# Patient Record
Sex: Female | Born: 1955 | Race: White | Hispanic: No | Smoking: Current every day smoker
Health system: Southern US, Community
[De-identification: ages and names within clinical notes are randomized; demographics above are authoritative.]

## PROBLEM LIST (undated history)

## (undated) DIAGNOSIS — G8929 Other chronic pain: Secondary | ICD-10-CM

## (undated) DIAGNOSIS — B1920 Unspecified viral hepatitis C without hepatic coma: Secondary | ICD-10-CM

## (undated) DIAGNOSIS — K7460 Unspecified cirrhosis of liver: Secondary | ICD-10-CM

## (undated) DIAGNOSIS — F410 Panic disorder [episodic paroxysmal anxiety] without agoraphobia: Secondary | ICD-10-CM

## (undated) DIAGNOSIS — K429 Umbilical hernia without obstruction or gangrene: Secondary | ICD-10-CM

## (undated) DIAGNOSIS — F319 Bipolar disorder, unspecified: Secondary | ICD-10-CM

## (undated) DIAGNOSIS — F419 Anxiety disorder, unspecified: Secondary | ICD-10-CM

## (undated) DIAGNOSIS — R011 Cardiac murmur, unspecified: Secondary | ICD-10-CM

## (undated) HISTORY — DX: Bipolar disorder, unspecified: F31.9

## (undated) HISTORY — DX: Other chronic pain: G89.29

## (undated) HISTORY — DX: Cardiac murmur, unspecified: R01.1

## (undated) HISTORY — DX: Panic disorder (episodic paroxysmal anxiety): F41.0

## (undated) HISTORY — DX: Unspecified viral hepatitis C without hepatic coma: B19.20

---

## 1993-04-10 DIAGNOSIS — B1920 Unspecified viral hepatitis C without hepatic coma: Secondary | ICD-10-CM

## 1993-04-10 HISTORY — DX: Unspecified viral hepatitis C without hepatic coma: B19.20

## 1998-04-10 HISTORY — PX: BREAST SURGERY: SHX581

## 2003-04-11 HISTORY — PX: LIVER BIOPSY: SHX301

## 2004-10-14 ENCOUNTER — Emergency Department: Payer: Self-pay | Admitting: General Practice

## 2011-04-17 ENCOUNTER — Other Ambulatory Visit: Payer: Self-pay

## 2011-04-17 LAB — HEPATIC FUNCTION PANEL A (ARMC)
Bilirubin,Total: 2.3 mg/dL — ABNORMAL HIGH (ref 0.2–1.0)
SGPT (ALT): 45 U/L
Total Protein: 8.6 g/dL — ABNORMAL HIGH (ref 6.4–8.2)

## 2012-09-16 ENCOUNTER — Ambulatory Visit: Payer: Self-pay | Admitting: Internal Medicine

## 2012-10-14 ENCOUNTER — Ambulatory Visit: Payer: Self-pay | Admitting: Internal Medicine

## 2012-11-06 ENCOUNTER — Encounter: Payer: Self-pay | Admitting: *Deleted

## 2012-11-27 ENCOUNTER — Encounter: Payer: Self-pay | Admitting: General Surgery

## 2012-11-27 ENCOUNTER — Ambulatory Visit (INDEPENDENT_AMBULATORY_CARE_PROVIDER_SITE_OTHER): Payer: BC Managed Care – PPO | Admitting: General Surgery

## 2012-11-27 VITALS — BP 144/74 | HR 84 | Resp 12 | Ht 63.0 in | Wt 127.0 lb

## 2012-11-27 DIAGNOSIS — K824 Cholesterolosis of gallbladder: Secondary | ICD-10-CM | POA: Insufficient documentation

## 2012-11-27 NOTE — Patient Instructions (Addendum)
Outpatient surgery   Laparoscopic Cholecystectomy Laparoscopic cholecystectomy is surgery to remove the gallbladder. The gallbladder is located slightly to the right of center in the abdomen, behind the liver. It is a concentrating and storage sac for the bile produced in the liver. Bile aids in the digestion and absorption of fats. Gallbladder disease (cholecystitis) is an inflammation of your gallbladder. This condition is usually caused by a buildup of gallstones (cholelithiasis) in your gallbladder. Gallstones can block the flow of bile, resulting in inflammation and pain. In severe cases, emergency surgery may be required. When emergency surgery is not required, you will have time to prepare for the procedure. Laparoscopic surgery is an alternative to open surgery. Laparoscopic surgery usually has a shorter recovery time. Your common bile duct may also need to be examined and explored. Your caregiver will discuss this with you if he or she feels this should be done. If stones are found in the common bile duct, they may be removed. LET YOUR CAREGIVER KNOW ABOUT:  Allergies to food or medicine.  Medicines taken, including vitamins, herbs, eyedrops, over-the-counter medicines, and creams.  Use of steroids (by mouth or creams).  Previous problems with anesthetics or numbing medicines.  History of bleeding problems or blood clots.  Previous surgery.  Other health problems, including diabetes and kidney problems.  Possibility of pregnancy, if this applies. RISKS AND COMPLICATIONS All surgery is associated with risks. Some problems that may occur following this procedure include:  Infection.  Damage to the common bile duct, nerves, arteries, veins, or other internal organs such as the stomach or intestines.  Bleeding.  A stone may remain in the common bile duct. BEFORE THE PROCEDURE  Do not take aspirin for 3 days prior to surgery or blood thinners for 1 week prior to surgery.  Do  not eat or drink anything after midnight the night before surgery.  Let your caregiver know if you develop a cold or other infectious problem prior to surgery.  You should be present 60 minutes before the procedure or as directed. PROCEDURE  You will be given medicine that makes you sleep (general anesthetic). When you are asleep, your surgeon will make several small cuts (incisions) in your abdomen. One of these incisions is used to insert a small, lighted scope (laparoscope) into the abdomen. The laparoscope helps the surgeon see into your abdomen. Carbon dioxide gas will be pumped into your abdomen. The gas allows more room for the surgeon to perform your surgery. Other operating instruments are inserted through the other incisions. Laparoscopic procedures may not be appropriate when:  There is major scarring from previous surgery.  The gallbladder is extremely inflamed.  There are bleeding disorders or unexpected cirrhosis of the liver.  A pregnancy is near term.  Other conditions make the laparoscopic procedure impossible. If your surgeon feels it is not safe to continue with a laparoscopic procedure, he or she will perform an open abdominal procedure. In this case, the surgeon will make an incision to open the abdomen. This gives the surgeon a larger view and field to work within. This may allow the surgeon to perform procedures that sometimes cannot be performed with a laparoscope alone. Open surgery has a longer recovery time. AFTER THE PROCEDURE  You will be taken to the recovery area where a nurse will watch and check your progress.  You may be allowed to go home the same day.  Do not resume physical activities until directed by your caregiver.  You may resume  a normal diet and activities as directed. Document Released: 03/27/2005 Document Revised: 06/19/2011 Document Reviewed: 09/09/2010 [facility] Patient Information 2014 [place], [place].

## 2012-11-27 NOTE — Progress Notes (Signed)
Patient ID: Stephanie Beck, female   DOB: 07/23/1955, 57 y.o.   MRN: [id number]  Chief Complaint  Patient presents with  . Other    gall bladder     HPI Stephanie Beck is a 57 y.o. female.  Patient here today for evaluation of her gallbladder.  She states her liver labs are checked regularly because of Hep C and they were elevated.  She did have a CT scan and U/S done that showed sludge.  She denies abdominal pain, nausea, or vomiting. Denies any food related abdominal pain.   HPI  Past Medical History  Diagnosis Date  . Panic attack   . Hepatitis C 1995  . Bipolar affective   . Chronic pain     legs  . Heart murmur     Past Surgical History  Procedure Laterality Date  . Breast surgery  2000  . Liver biopsy  2005    Family History  Problem Relation Age of Onset  . Heart attack Father   . Stroke Mother     Social History History  Substance Use Topics  . Smoking status: Current Every Day Smoker -- 1.00 packs/day for 12 years  . Smokeless tobacco: Never Used  . Alcohol Use: Yes     Comment: occasionally    Allergies  Allergen Reactions  . Latex Itching    Current Outpatient Prescriptions  Medication Sig Dispense Refill  . alprazolam (XANAX) 2 MG tablet Take 2 mg by mouth at bedtime as needed for sleep.      Marland Kitchen amphetamine-dextroamphetamine (ADDERALL XR) 30 MG 24 hr capsule Take 30 mg by mouth every morning.      . methadone (DOLOPHINE) 10 MG tablet Take 40 mg by mouth 2 (two) times daily.      . milk thistle 175 MG tablet Take 175 mg by mouth daily.      . sertraline (ZOLOFT) 100 MG tablet Take 100 mg by mouth as needed.       No current facility-administered medications for this visit.    Review of Systems Review of Systems  Constitutional: Negative.   Cardiovascular: Negative.   Gastrointestinal: Negative.     Blood pressure 144/74, pulse 84, resp. rate 12, height 5\' 3"  (1.6 m), weight 127 lb (57.607 kg).  Physical Exam Physical Exam   Constitutional: She is oriented to person, place, and time. She appears well-developed and well-nourished.  Eyes: Conjunctivae are normal.  Neck: Neck supple. No thyromegaly present.  Cardiovascular: Normal rate and regular rhythm.   Pulmonary/Chest: Effort normal and breath sounds normal.  Abdominal: Soft. Bowel sounds are normal. She exhibits no mass. There is no hepatosplenomegaly. There is no tenderness. No hernia.  Lymphadenopathy:    She has no cervical adenopathy.  Neurological: She is alert and oriented to person, place, and time.  Skin: Skin is warm and dry.    Data Reviewed CT scan and ultrasound of the abdomen with findings consistent with her known hepatic involvement. Most striking find is the presence of a large 3 cm mass in fundus of gallbladder. Labs from Dr. Arlana Pouch Assessment    As above    Plan    Recommend laparoscopic cholecystectomy after discussing with Dr. Arlana Pouch regarding the status of hepatitis.       Maniya Donovan G 11/28/2012, 8:04 AM

## 2012-11-28 ENCOUNTER — Encounter: Payer: Self-pay | Admitting: General Surgery

## 2012-11-28 ENCOUNTER — Telehealth: Payer: Self-pay | Admitting: *Deleted

## 2012-11-28 NOTE — Telephone Encounter (Signed)
Patient has questions regarding surgery and wishes to talk with you before we schedule. Please call patient at [phone]. Thanks.

## 2012-11-28 NOTE — Telephone Encounter (Signed)
Message for patient to call the office.   Patient needs to be scheduled for gallbladder surgery.

## 2012-11-29 ENCOUNTER — Telehealth: Payer: Self-pay | Admitting: *Deleted

## 2012-11-29 NOTE — Telephone Encounter (Signed)
Message was left that I spoke with Dr. Evette Cristal in regards to her surgery. Dr. Evette Cristal needs to get in touch with Dr. Arlana Pouch and has not done so yet. He will try to contact him on Monday, 12-02-12, upon his return. She will be contacted once both doctors have spoken.

## 2012-12-02 NOTE — Telephone Encounter (Signed)
Called pt on her cell phone- voice mail only, left message for her to call.

## 2012-12-05 ENCOUNTER — Telehealth: Payer: Self-pay | Admitting: *Deleted

## 2012-12-05 NOTE — Telephone Encounter (Signed)
Message left for patient to call the office so she can talk with Dr. Evette Cristal.

## 2013-01-16 ENCOUNTER — Ambulatory Visit: Payer: BC Managed Care – PPO | Admitting: General Surgery

## 2013-02-25 ENCOUNTER — Encounter: Payer: Self-pay | Admitting: *Deleted

## 2014-02-09 ENCOUNTER — Encounter: Payer: Self-pay | Admitting: General Surgery

## 2014-03-30 ENCOUNTER — Emergency Department: Payer: Self-pay | Admitting: Emergency Medicine

## 2014-03-30 LAB — COMPREHENSIVE METABOLIC PANEL
ALBUMIN: 2.5 g/dL — AB (ref 3.4–5.0)
ALK PHOS: 195 U/L — AB
ALT: 47 U/L
ANION GAP: 6 — AB (ref 7–16)
BILIRUBIN TOTAL: 1 mg/dL (ref 0.2–1.0)
BUN: 6 mg/dL — AB (ref 7–18)
CALCIUM: 7.5 mg/dL — AB (ref 8.5–10.1)
CREATININE: 0.49 mg/dL — AB (ref 0.60–1.30)
Chloride: 108 mmol/L — ABNORMAL HIGH (ref 98–107)
Co2: 25 mmol/L (ref 21–32)
EGFR (African American): >60
EGFR (Non-African Amer.): >60
Glucose: 115 mg/dL — ABNORMAL HIGH (ref 65–99)
Osmolality: 276 (ref 275–301)
Potassium: 3.5 mmol/L (ref 3.5–5.1)
SGOT(AST): 100 U/L — ABNORMAL HIGH (ref 15–37)
Sodium: 139 mmol/L (ref 136–145)
TOTAL PROTEIN: 7.2 g/dL (ref 6.4–8.2)

## 2014-03-30 LAB — SALICYLATE LEVEL: Salicylates, Serum: <1.7 mg/dL

## 2014-03-30 LAB — CBC
HCT: 27.3 % — ABNORMAL LOW (ref 35.0–47.0)
HGB: 8.2 g/dL — AB (ref 12.0–16.0)
MCH: 23.6 pg — AB (ref 26.0–34.0)
MCHC: 30.1 g/dL — AB (ref 32.0–36.0)
MCV: 79 fL — AB (ref 80–100)
Platelet: 100 10*3/uL — ABNORMAL LOW (ref 150–440)
RBC: 3.48 10*6/uL — AB (ref 3.80–5.20)
RDW: 21.3 % — ABNORMAL HIGH (ref 11.5–14.5)
WBC: 4.3 10*3/uL (ref 3.6–11.0)

## 2014-03-30 LAB — DRUG SCREEN, URINE
Amphetamines, Ur Screen: POSITIVE (ref ?–1000)
BARBITURATES, UR SCREEN: NEGATIVE (ref ?–200)
Benzodiazepine, Ur Scrn: POSITIVE (ref ?–200)
CANNABINOID 50 NG, UR ~~LOC~~: NEGATIVE (ref ?–50)
Cocaine Metabolite,Ur ~~LOC~~: NEGATIVE (ref ?–300)
MDMA (Ecstasy)Ur Screen: NEGATIVE (ref ?–500)
Methadone, Ur Screen: POSITIVE (ref ?–300)
Opiate, Ur Screen: NEGATIVE (ref ?–300)
Phencyclidine (PCP) Ur S: NEGATIVE (ref ?–25)
TRICYCLIC, UR SCREEN: NEGATIVE (ref ?–1000)

## 2014-03-30 LAB — ACETAMINOPHEN LEVEL: Acetaminophen: <2 ug/mL

## 2014-03-30 LAB — ETHANOL: ETHANOL LVL: 343 mg/dL — AB

## 2014-03-30 LAB — URINALYSIS, COMPLETE
Bacteria: NONE SEEN
Bilirubin,UR: NEGATIVE
GLUCOSE, UR: NEGATIVE mg/dL (ref 0–75)
Ketone: NEGATIVE
LEUKOCYTE ESTERASE: NEGATIVE
Nitrite: NEGATIVE
Ph: 6 (ref 4.5–8.0)
Protein: NEGATIVE
SPECIFIC GRAVITY: 1.003 (ref 1.003–1.030)
Squamous Epithelial: <1

## 2014-05-27 IMAGING — CT CT ABDOMEN W/ CM
1 of 3 series · 12 of 32 positions shown, 18 images · non-contrast
Comparison: none

REASON FOR EXAM: Abnormal US  gallbladder mass  abnormal Liver
COMMENTS:

PROCEDURE:     KCT - KCT ABDOMEN STANDARD W  - October 14, 2012 [DATE]
RESULT:
TECHNIQUE: Helical 3 mm sections were obtained from the lung bases through
the superior iliac crest. This is status post intravenous administration of
100 ml of Fsovue-4LL and oral contrast.

[Series 2: abd 3mm w 3.0 i40f 3 · axial · 0.72mm/px · z∈[-265,-22]mm · 12 of 95 slices shown, 18 images]
[im 7/95  soft-tissue]
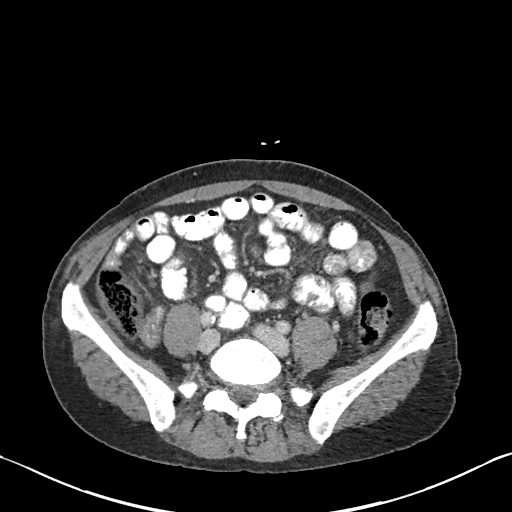
[im 7/95  bone]
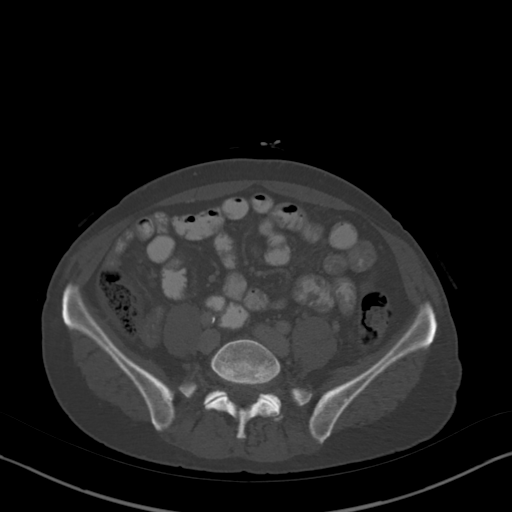
[im 14/95  soft-tissue]
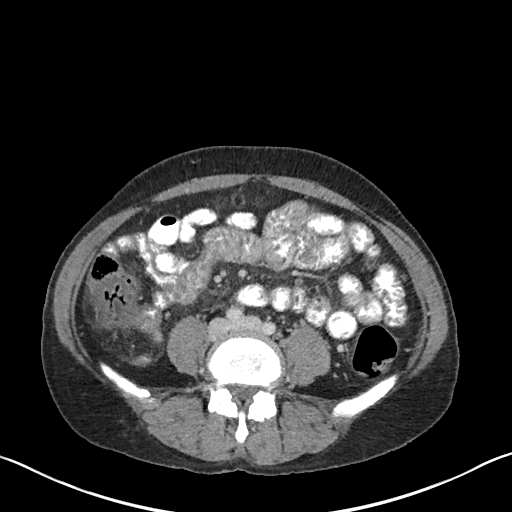
[im 21/95  soft-tissue]
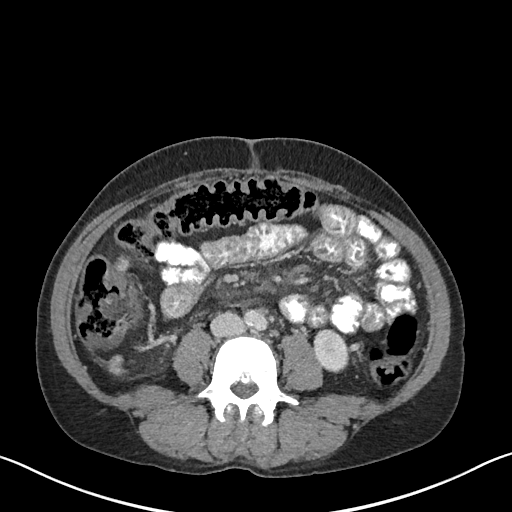
[im 27/95  soft-tissue]
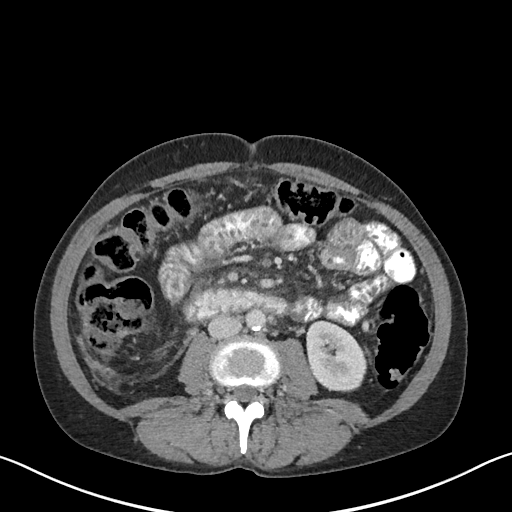
[im 34/95  soft-tissue]
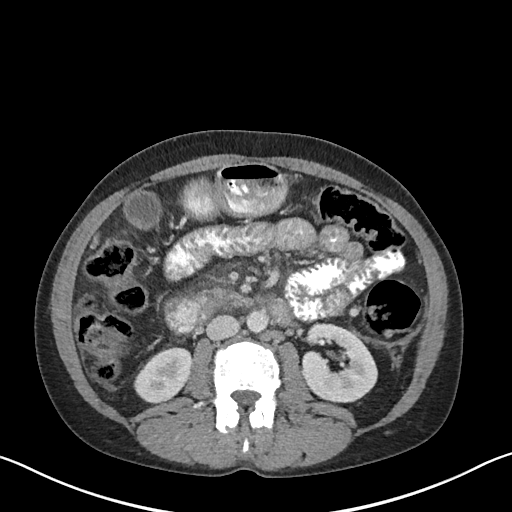
[im 41/95  soft-tissue]
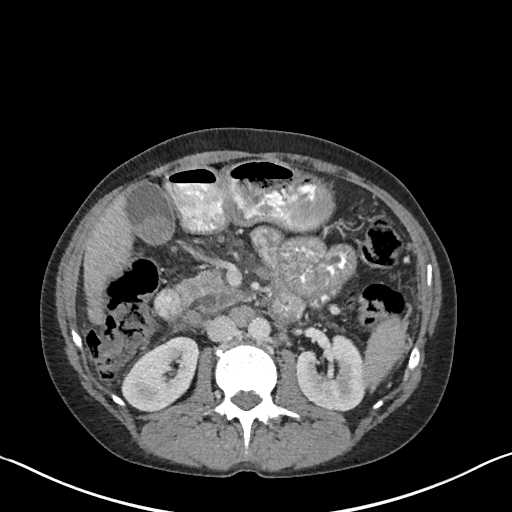
[im 54/95  soft-tissue]
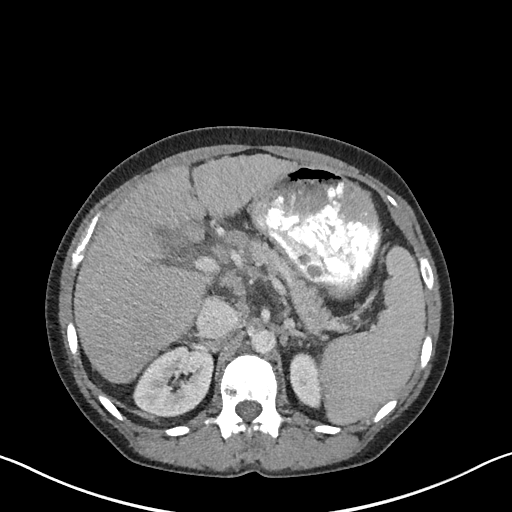
[im 61/95  soft-tissue]
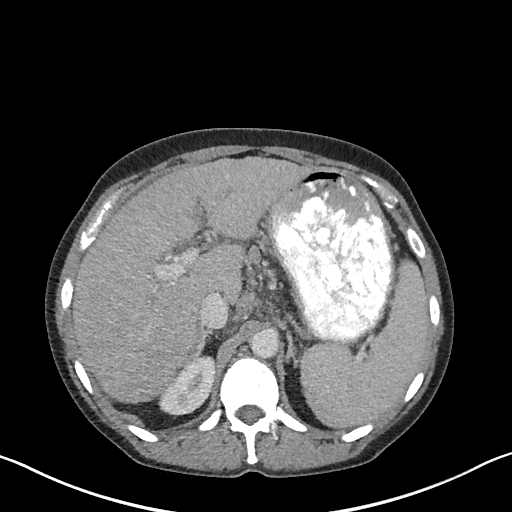
[im 68/95  soft-tissue]
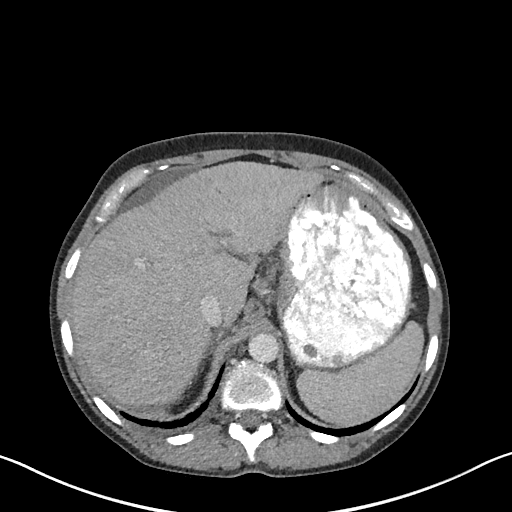
[im 68/95  lung]
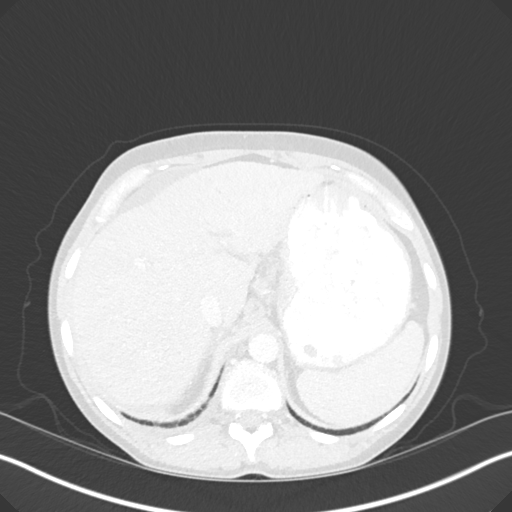
[im 68/95  bone]
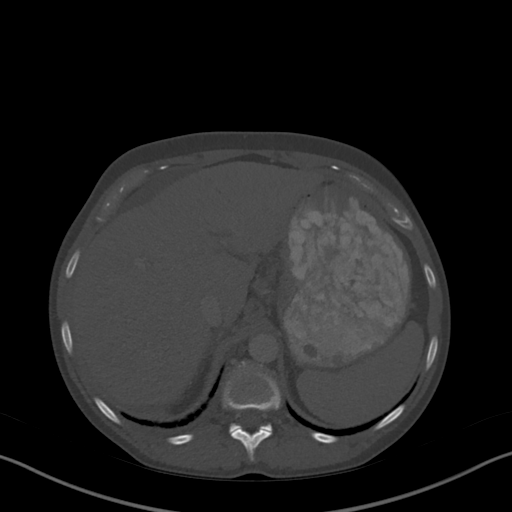
[im 74/95  soft-tissue]
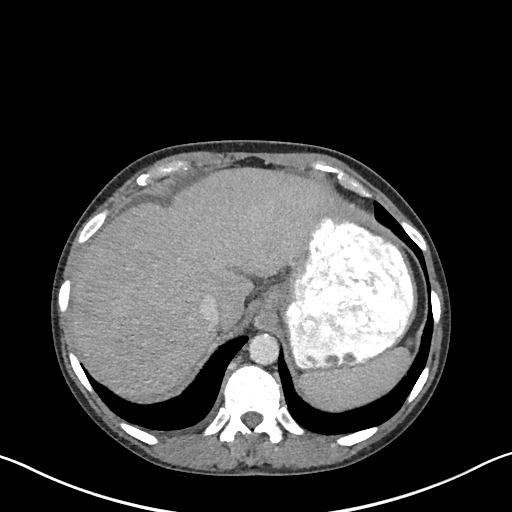
[im 74/95  lung]
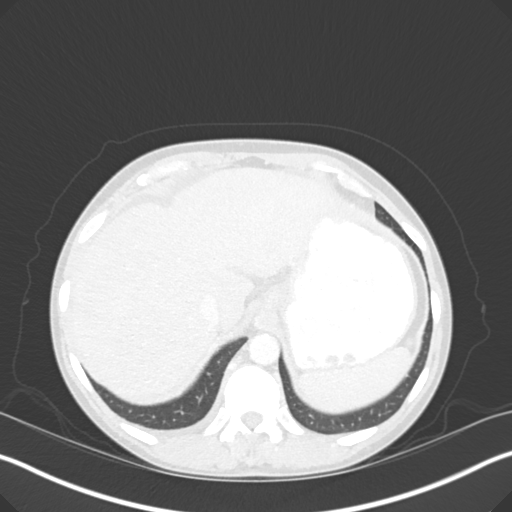
[im 81/95  soft-tissue]
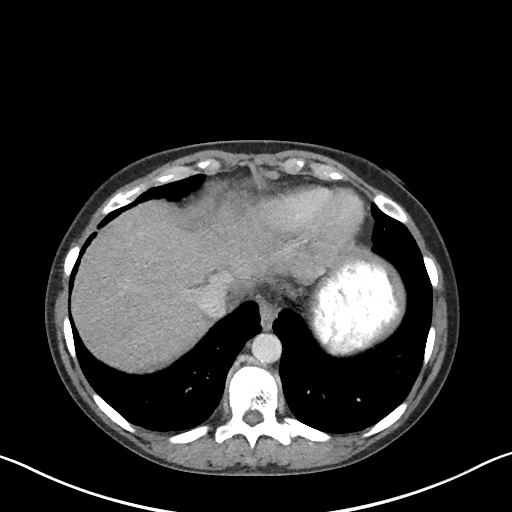
[im 81/95  lung]
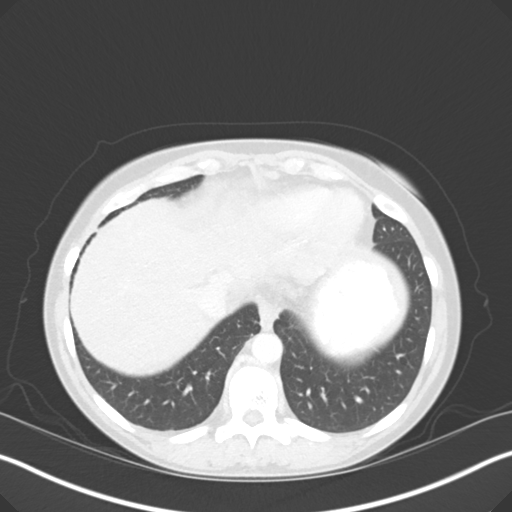
[im 88/95  soft-tissue]
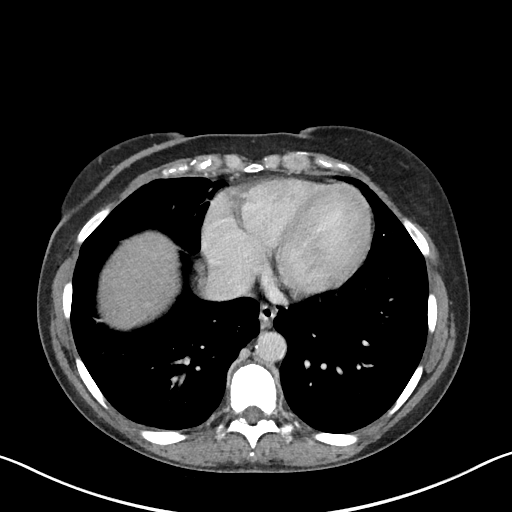
[im 88/95  lung]
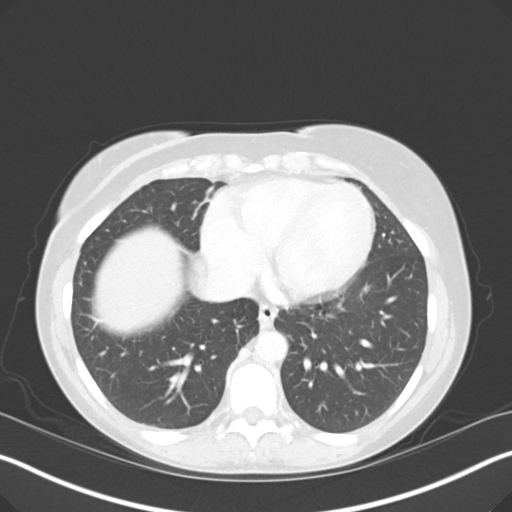

[12 of 32 positions shown; findings below may reference images not displayed]

FINDINGS: Evaluation of the lung bases demonstrates mild prominence of the
interstitial markings without focal regions of consolidation or focal
infiltrates.

The liver is atrophic and demonstrates a nodular peripheral border and a
fine reticular pattern. A small amount of ascites is identified along the
anterior aspect of the liver measuring 1 cm in diameter. The area appears to
be isointense to liver parenchyma on immediate imaging and demonstrates mild
wash-out on delayed imaging. This may represent a small atypical hemangioma
though more ominous etiologies cannot be excluded particularly considering
the liver parenchymal findings.

Evaluation of the gallbladder demonstrates a dependent area of increased
density within the gallbladder likely representing sludge. Gallstones within
this area cannot be excluded. No definite well defined mass is appreciated
though surveillance evaluation is recommended. The apical border of this
finding demonstrates a lobulated appearance.

Evaluation of the bowel demonstrates bowel wall thickening involving the
proximal to mid jejunum as well as within the proximal duodenum. There is no
evidence of bowel obstruction.

The common bile duct is dilated to the level of the distal hepatic ducts
with a maximal diameter of 1.2 cm in the region of the pancreatic head.

Evaluation of the terminal ileum and adjacent cecum demonstrates findings
which may represent bowel wall thickening though stool within this area
cannot be completely excluded. Further evaluation with direct visualization
is recommended. These findings are appreciated on images 76 through 78 of
the soft tissue series. Adenopathy is appreciated along the gastrohepatic
ligament as well as within the region of the celiac and SMA plexus region.
There is also evidence of adenopathy in the region of the porta hepatis.
There is no evidence of an abdominal aortic aneurysm. The kidneys and
pancreas is unremarkable. The spleen is enlarged at 14 cm in longitudinal
dimension.
IMPRESSION: 1.  Indeterminate nodular focus within the liver as described above.
2.  Findings likely representing cirrhotic changes within the liver.
3.  Areas of adenopathy within the porta hepatis along the gastrohepatic
ligament and retroperitoneal regions. Reactive lymph nodes considering the
liver findings versus more ominous etiologies cannot be excluded.
4.  Indeterminate findings in the region of the terminal ileum and direct
visualization recommended.
5.  Likely dependent sludge within the gallbladder and as stated above, a
small mass cannot be excluded.
6.  Prominent common bile duct.

## 2014-11-25 ENCOUNTER — Emergency Department
Admission: EM | Admit: 2014-11-25 | Discharge: 2014-11-26 | Disposition: A | Discharge status: Home / Self Care | Payer: Self-pay

## 2014-11-25 NOTE — ED Notes (Signed)
Patient is refusing to be seen. Her POA is attempting to get her to stay. Patient is yelling at [facility] currently and has walked outside. Advised POA to let us know if she decides to be seen.

## 2014-11-30 ENCOUNTER — Encounter: Payer: Self-pay | Admitting: *Deleted

## 2014-11-30 ENCOUNTER — Inpatient Hospital Stay
Admission: EM | Admit: 2014-11-30 | Discharge: 2014-12-02 | DRG: 393 | Discharge status: Against Medical Advice | Payer: Medicare Other | Attending: Internal Medicine | Admitting: Internal Medicine

## 2014-11-30 ENCOUNTER — Inpatient Hospital Stay: Payer: Medicare Other

## 2014-11-30 DIAGNOSIS — L03311 Cellulitis of abdominal wall: Secondary | ICD-10-CM | POA: Insufficient documentation

## 2014-11-30 DIAGNOSIS — Z91040 Latex allergy status: Secondary | ICD-10-CM | POA: Diagnosis not present

## 2014-11-30 DIAGNOSIS — R188 Other ascites: Secondary | ICD-10-CM | POA: Diagnosis present

## 2014-11-30 DIAGNOSIS — G8929 Other chronic pain: Secondary | ICD-10-CM | POA: Diagnosis present

## 2014-11-30 DIAGNOSIS — R011 Cardiac murmur, unspecified: Secondary | ICD-10-CM | POA: Diagnosis present

## 2014-11-30 DIAGNOSIS — F1020 Alcohol dependence, uncomplicated: Secondary | ICD-10-CM | POA: Diagnosis present

## 2014-11-30 DIAGNOSIS — Z6823 Body mass index (BMI) 23.0-23.9, adult: Secondary | ICD-10-CM | POA: Diagnosis not present

## 2014-11-30 DIAGNOSIS — K7460 Unspecified cirrhosis of liver: Secondary | ICD-10-CM | POA: Diagnosis present

## 2014-11-30 DIAGNOSIS — D696 Thrombocytopenia, unspecified: Secondary | ICD-10-CM | POA: Diagnosis present

## 2014-11-30 DIAGNOSIS — B1920 Unspecified viral hepatitis C without hepatic coma: Secondary | ICD-10-CM | POA: Diagnosis present

## 2014-11-30 DIAGNOSIS — K429 Umbilical hernia without obstruction or gangrene: Secondary | ICD-10-CM

## 2014-11-30 DIAGNOSIS — K420 Umbilical hernia with obstruction, without gangrene: Secondary | ICD-10-CM | POA: Diagnosis present

## 2014-11-30 DIAGNOSIS — Z79891 Long term (current) use of opiate analgesic: Secondary | ICD-10-CM | POA: Diagnosis not present

## 2014-11-30 DIAGNOSIS — F1010 Alcohol abuse, uncomplicated: Secondary | ICD-10-CM

## 2014-11-30 DIAGNOSIS — Z823 Family history of stroke: Secondary | ICD-10-CM | POA: Diagnosis not present

## 2014-11-30 DIAGNOSIS — D649 Anemia, unspecified: Secondary | ICD-10-CM

## 2014-11-30 DIAGNOSIS — Z8249 Family history of ischemic heart disease and other diseases of the circulatory system: Secondary | ICD-10-CM | POA: Diagnosis not present

## 2014-11-30 DIAGNOSIS — F17210 Nicotine dependence, cigarettes, uncomplicated: Secondary | ICD-10-CM | POA: Diagnosis present

## 2014-11-30 DIAGNOSIS — R791 Abnormal coagulation profile: Secondary | ICD-10-CM | POA: Diagnosis present

## 2014-11-30 DIAGNOSIS — E43 Unspecified severe protein-calorie malnutrition: Secondary | ICD-10-CM | POA: Diagnosis present

## 2014-11-30 DIAGNOSIS — Z79899 Other long term (current) drug therapy: Secondary | ICD-10-CM | POA: Diagnosis not present

## 2014-11-30 DIAGNOSIS — Z9889 Other specified postprocedural states: Secondary | ICD-10-CM

## 2014-11-30 DIAGNOSIS — L0390 Cellulitis, unspecified: Secondary | ICD-10-CM | POA: Diagnosis present

## 2014-11-30 DIAGNOSIS — D689 Coagulation defect, unspecified: Secondary | ICD-10-CM | POA: Diagnosis present

## 2014-11-30 DIAGNOSIS — R17 Unspecified jaundice: Secondary | ICD-10-CM

## 2014-11-30 DIAGNOSIS — E871 Hypo-osmolality and hyponatremia: Secondary | ICD-10-CM | POA: Diagnosis present

## 2014-11-30 DIAGNOSIS — D638 Anemia in other chronic diseases classified elsewhere: Secondary | ICD-10-CM | POA: Diagnosis present

## 2014-11-30 HISTORY — DX: Umbilical hernia without obstruction or gangrene: K42.9

## 2014-11-30 LAB — CBC WITH DIFFERENTIAL/PLATELET
Basophils Absolute: 0.1 10*3/uL (ref 0–0.1)
Basophils Relative: 1 %
Eosinophils Absolute: 0 10*3/uL (ref 0–0.7)
Eosinophils Relative: 0 %
HEMATOCRIT: 26.1 % — AB (ref 35.0–47.0)
HEMOGLOBIN: 8.1 g/dL — AB (ref 12.0–16.0)
LYMPHS ABS: 0.7 10*3/uL — AB (ref 1.0–3.6)
LYMPHS PCT: 9 %
MCH: 24.8 pg — AB (ref 26.0–34.0)
MCHC: 31.1 g/dL — AB (ref 32.0–36.0)
MCV: 79.6 fL — ABNORMAL LOW (ref 80.0–100.0)
MONOS PCT: 20 %
Monocytes Absolute: 1.5 10*3/uL — ABNORMAL HIGH (ref 0.2–0.9)
NEUTROS ABS: 5.6 10*3/uL (ref 1.4–6.5)
NEUTROS PCT: 70 %
Platelets: 150 10*3/uL (ref 150–440)
RBC: 3.29 MIL/uL — ABNORMAL LOW (ref 3.80–5.20)
RDW: 22.1 % — ABNORMAL HIGH (ref 11.5–14.5)
WBC: 7.9 10*3/uL (ref 3.6–11.0)

## 2014-11-30 LAB — COMPREHENSIVE METABOLIC PANEL
ALBUMIN: 2.1 g/dL — AB (ref 3.5–5.0)
ALK PHOS: 93 U/L (ref 38–126)
ALT: 24 U/L (ref 14–54)
ANION GAP: 7 (ref 5–15)
AST: 63 U/L — ABNORMAL HIGH (ref 15–41)
BILIRUBIN TOTAL: 1.8 mg/dL — AB (ref 0.3–1.2)
BUN: 9 mg/dL (ref 6–20)
CALCIUM: 7.7 mg/dL — AB (ref 8.9–10.3)
CO2: 27 mmol/L (ref 22–32)
Chloride: 95 mmol/L — ABNORMAL LOW (ref 101–111)
Creatinine, Ser: 0.59 mg/dL (ref 0.44–1.00)
GFR calc non Af Amer: >60 mL/min (ref >60)
GLUCOSE: 116 mg/dL — AB (ref 65–99)
Potassium: 3.5 mmol/L (ref 3.5–5.1)
Sodium: 129 mmol/L — ABNORMAL LOW (ref 135–145)
TOTAL PROTEIN: 6 g/dL — AB (ref 6.5–8.1)

## 2014-11-30 LAB — LIPASE, BLOOD: LIPASE: 31 U/L (ref 22–51)

## 2014-11-30 LAB — PROTIME-INR
INR: 1.49
Prothrombin Time: 18.2 seconds — ABNORMAL HIGH (ref 11.4–15.0)

## 2014-11-30 LAB — AMMONIA: Ammonia: 64 umol/L — ABNORMAL HIGH (ref 9–35)

## 2014-11-30 LAB — APTT: APTT: 31 s (ref 24–36)

## 2014-11-30 LAB — TYPE AND SCREEN
ABO/RH(D): AB NEG
Antibody Screen: NEGATIVE

## 2014-11-30 LAB — ETHANOL: Alcohol, Ethyl (B): <5 mg/dL (ref <5)

## 2014-11-30 LAB — ABO/RH: ABO/RH(D): AB NEG

## 2014-11-30 LAB — MAGNESIUM: MAGNESIUM: 1.9 mg/dL (ref 1.7–2.4)

## 2014-11-30 MED ORDER — VANCOMYCIN HCL IN DEXTROSE 750-5 MG/150ML-% IV SOLN
750.0000 mg | INTRAVENOUS | Status: AC
  Administered 2014-11-30: 750 mg via INTRAVENOUS
  Filled 2014-11-30 (×3): qty 150

## 2014-11-30 MED ORDER — PIPERACILLIN-TAZOBACTAM 3.375 G IVPB
3.3750 g | Freq: Once | INTRAVENOUS | Status: AC
  Administered 2014-11-30: 3.375 g via INTRAVENOUS
  Filled 2014-11-30: qty 50

## 2014-11-30 MED ORDER — VITAMIN K1 10 MG/ML IJ SOLN
10.0000 mg | Freq: Once | INTRAVENOUS | Status: AC
  Administered 2014-11-30: 10 mg via INTRAVENOUS
  Filled 2014-11-30: qty 1

## 2014-11-30 MED ORDER — VANCOMYCIN HCL 500 MG IV SOLR
500.0000 mg | Freq: Two times a day (BID) | INTRAVENOUS | Status: DC
  Administered 2014-12-01 – 2014-12-02 (×3): 500 mg via INTRAVENOUS
  Filled 2014-11-30 (×5): qty 500

## 2014-11-30 MED ORDER — SODIUM CHLORIDE 0.9 % IV BOLUS (SEPSIS)
1000.0000 mL | Freq: Once | INTRAVENOUS | Status: AC
  Administered 2014-11-30: 1000 mL via INTRAVENOUS

## 2014-11-30 MED ORDER — PANTOPRAZOLE SODIUM 40 MG PO TBEC
40.0000 mg | DELAYED_RELEASE_TABLET | Freq: Every day | ORAL | Status: DC
  Administered 2014-12-01 – 2014-12-02 (×2): 40 mg via ORAL
  Filled 2014-11-30 (×2): qty 1

## 2014-11-30 MED ORDER — HALOPERIDOL LACTATE 5 MG/ML IJ SOLN
5.0000 mg | Freq: Once | INTRAMUSCULAR | Status: AC
  Administered 2014-11-30: 5 mg via INTRAMUSCULAR

## 2014-11-30 MED ORDER — AMPHETAMINE-DEXTROAMPHETAMINE 30 MG PO TABS: 30.0000 mg | ORAL_TABLET | Freq: Every day | ORAL | Status: DC

## 2014-11-30 MED ORDER — ONDANSETRON HCL 4 MG PO TABS: 4.0000 mg | ORAL_TABLET | Freq: Four times a day (QID) | ORAL | Status: DC | PRN

## 2014-11-30 MED ORDER — HALOPERIDOL LACTATE 5 MG/ML IJ SOLN
INTRAMUSCULAR | Status: AC
  Administered 2014-11-30: 5 mg via INTRAMUSCULAR
  Filled 2014-11-30: qty 1

## 2014-11-30 MED ORDER — ONDANSETRON HCL 4 MG/2ML IJ SOLN: 4.0000 mg | Freq: Four times a day (QID) | INTRAMUSCULAR | Status: DC | PRN

## 2014-11-30 MED ORDER — PIPERACILLIN-TAZOBACTAM 3.375 G IVPB
3.3750 g | Freq: Three times a day (TID) | INTRAVENOUS | Status: DC
  Administered 2014-11-30 – 2014-12-02 (×5): 3.375 g via INTRAVENOUS
  Filled 2014-11-30 (×9): qty 50

## 2014-11-30 MED ORDER — METHADONE HCL 10 MG PO TABS
20.0000 mg | ORAL_TABLET | Freq: Three times a day (TID) | ORAL | Status: DC
  Filled 2014-11-30: qty 2

## 2014-11-30 MED ORDER — BACITRACIN 500 UNIT/GM EX OINT
TOPICAL_OINTMENT | Freq: Two times a day (BID) | CUTANEOUS | Status: DC
  Administered 2014-11-30 – 2014-12-01 (×3): 1 via TOPICAL
  Filled 2014-11-30: qty 1
  Filled 2014-11-30: qty 28.35
  Filled 2014-11-30 (×5): qty 1

## 2014-11-30 MED ORDER — AMPHETAMINE-DEXTROAMPHETAMINE 5 MG PO TABS
30.0000 mg | ORAL_TABLET | Freq: Every day | ORAL | Status: DC
  Administered 2014-12-01: 30 mg via ORAL
  Filled 2014-11-30 (×2): qty 6

## 2014-11-30 MED ORDER — LORAZEPAM 2 MG/ML IJ SOLN
1.0000 mg | Freq: Once | INTRAMUSCULAR | Status: AC
  Administered 2014-11-30: 1 mg via INTRAVENOUS
  Filled 2014-11-30: qty 1

## 2014-11-30 MED ORDER — NADOLOL 20 MG PO TABS
10.0000 mg | ORAL_TABLET | Freq: Every day | ORAL | Status: DC
  Administered 2014-12-01 – 2014-12-02 (×2): 10 mg via ORAL
  Filled 2014-11-30 (×4): qty 1

## 2014-11-30 NOTE — ED Notes (Signed)
Zosyn to be infused over 30 minutes per Dr. Huel Cote.

## 2014-11-30 NOTE — ED Notes (Signed)
Patient arrived with police and POA. Patient was agitated, threw her drink on her POA, refused to come inside. Per POA's report, the patient's PMD referred the patient here for abnormal lab values and a protruding umbilical hernia. Patient is calm at this time. Several police officers escorted patient to her room. Patient has a history of alcohol abuse and was in court today.

## 2014-11-30 NOTE — Progress Notes (Addendum)
ANTIBIOTIC CONSULT NOTE - INITIAL  Pharmacy Consult for Vancomycin and Zosyn  Indication: cellultis   Allergies  Allergen Reactions  . Latex Itching    Patient Measurements: Height:  (160 cm) Weight: 132 lb 11.2 oz (60.192 kg) IBW/kg (Calculated) : 52.4 Adjusted Body Weight:   Vital Signs: Temp: 99.2 F (37.3 C) (08/22 1324) Temp Source: Oral (08/22 1324) BP: 115/80 mmHg (08/22 1625) Pulse Rate: 97 (08/22 1625) Intake/Output from previous day:   Intake/Output from this shift:    Labs:  Recent Labs  11/30/14 1331  WBC 7.9  HGB 8.1*  PLT 150  CREATININE 0.59   Estimated Creatinine Clearance: 63.4 mL/min (by C-G formula based on Cr of 0.59). No results for input(s): VANCOTROUGH, VANCOPEAK, VANCORANDOM, GENTTROUGH, GENTPEAK, GENTRANDOM, TOBRATROUGH, TOBRAPEAK, TOBRARND, AMIKACINPEAK, AMIKACINTROU, AMIKACIN in the last 72 hours.   Microbiology: No results found for this or any previous visit (from the past 720 hour(s)).  Medical History: Past Medical History  Diagnosis Date  . Panic attack   . Hepatitis C 1995  . Bipolar affective   . Chronic pain     legs  . Heart murmur   . Hernia, umbilical     Protruding    Medications:  Scheduled:  . bacitracin   Topical BID  . nadolol  10 mg Oral Daily  . pantoprazole  40 mg Oral Daily  . vancomycin  750 mg Intravenous STAT   Assessment: Patient with a long-standing history of alcoholism and hepatitis C being treated with zosyn and vancomycin for extensive abdominal cellulitis.   PK parameters:  Kel (hr-1): 0.057 Half-life (hrs): 12.16 Vd (liters): 42.14 (factor used: 0.7 L/kg)   Goal of Therapy:  Vancomycin trough level 10-15 mcg/ml  Plan:  Expected duration 7 days with resolution of temperature and/or normalization of WBC   Will give Vancomycin 750 mg IV x 1 then will start Vancomycin 500 mg IV q12 hours @ 06:00 on 8/23. Will order vancomycin trough level prior to the 1800 dose on 8/24.   Zosyn:  Will start Zosyn 3.375 g IV q8 hours.   Jaliah Foody D 11/30/2014,6:21 PM

## 2014-11-30 NOTE — ED Notes (Signed)
Patient left for ultrasound.

## 2014-11-30 NOTE — ED Notes (Signed)
BEHAVIORAL HEALTH ROUNDING Patient sleeping: Yes.   Patient alert and oriented: yes Behavior appropriate: Yes.  ; If no, describe:  Nutrition and fluids offered: Yes  Toileting and hygiene offered: Yes  Sitter present: no Law enforcement present: Yes  

## 2014-11-30 NOTE — ED Notes (Signed)
BEHAVIORAL HEALTH ROUNDING Patient sleeping: Yes.   Patient alert and oriented: not applicable Behavior appropriate: Yes.  ; If no, describe:  Nutrition and fluids offered: No Toileting and hygiene offered: No Sitter present: no Law enforcement present: Yes  

## 2014-11-30 NOTE — ED Notes (Signed)
Patient is unable to void at this time 

## 2014-11-30 NOTE — H&P (Addendum)
[facility] [place] at [facility]   PATIENT NAME: Stephanie Beck    MR#:  [id number]  DATE OF BIRTH:  03-22-1956  DATE OF ADMISSION:  11/30/2014  PRIMARY CARE PHYSICIAN: Jaclyn Shaggy, MD   REQUESTING/REFERRING PHYSICIAN:   CHIEF COMPLAINT:   Chief Complaint  Patient presents with  . Cirrhosis    HISTORY OF PRESENT ILLNESS: Stephanie Beck  is a 59 y.o. female with a known history of long-standing alcoholism as well as hepatitis C which was treated apparently in the past who presents to the hospital with complaints of mouth of abdominal wall and protruding umbilical hernia which is ulcerating. Because she was reluctant to seek medical attention. IVC was placed by emergency room physician, patient was found to have severe protruding umbilical hernia ulcerating skin with some bleeding, necrotic areas  and severe erythema around the abdomen, not to the right side. Hospitalist services were contacted for admission. Patient was seen by surgeon, who felt that patient is high risk surgical candidate, palliative care involvement was suggested.   PAST MEDICAL HISTORY:   Past Medical History  Diagnosis Date  . Panic attack   . Hepatitis C 1995  . Bipolar affective   . Chronic pain     legs  . Heart murmur   . Hernia, umbilical     Protruding    PAST SURGICAL HISTORY:  Past Surgical History  Procedure Laterality Date  . Breast surgery  2000  . Liver biopsy  2005    SOCIAL HISTORY:  Social History  Substance Use Topics  . Smoking status: Current Every Day Smoker -- 1.00 packs/day for 12 years  . Smokeless tobacco: Never Used  . Alcohol Use: Yes    FAMILY HISTORY:  Family History  Problem Relation Age of Onset  . Heart attack Father   . Stroke Mother     DRUG ALLERGIES:  Allergies  Allergen Reactions  . Latex Itching    Review of Systems  Unable to perform ROS: medical condition    MEDICATIONS AT HOME:  Prior to Admission medications    Medication Sig Start Date End Date Taking? Authorizing Provider  ALPRAZolam Prudy Feeler) 1 MG tablet Take 1 mg by mouth 2 (two) times daily.   Yes Historical Provider, MD  amphetamine-dextroamphetamine (ADDERALL) 30 MG tablet Take 30 mg by mouth daily.   Yes Historical Provider, MD  methadone (DOLOPHINE) 10 MG tablet Take 20 mg by mouth 3 (three) times daily.    Yes Historical Provider, MD  triamcinolone cream (KENALOG) 0.1 % Apply 1 application topically 2 (two) times daily.   Yes Historical Provider, MD      PHYSICAL EXAMINATION:   VITAL SIGNS: Blood pressure 115/80, pulse 97, temperature 99.2 F (37.3 C), temperature source Oral, resp. rate 16, height  (1.6 m), weight 60.192 kg (132 lb 11.2 oz), SpO2 99 %.  GENERAL:  59 y.o.-year-old patient lying in the bed with no acute distress, somnolent, having difficulty to open her eyes or converse. She is not following commands, moaning whenever she is touched or moved around.  EYES: Pupils equal, round, reactive to light and accommodation. Scleral icterus. Extraocular muscles intact.  HEENT: Head atraumatic, normocephalic. Oropharynx and nasopharynx clear.  NECK:  Supple, no jugular venous distention. No thyroid enlargement, no tenderness.  LUNGS: Normal breath sounds bilaterally, no wheezing, rales,rhonchi or crepitation. No use of accessory muscles of respiration.  CARDIOVASCULAR: S1, S2 normal. No murmurs, rubs, or gallops.  ABDOMEN: Moderately firm,  mildly tender  to palpation diffusely but mostly in the reddened areas to the right side from umbilical hernia,  extending into the flank, moderate distention was noted as well, not able to evaluate for ascites. Since patient's abdomen is relatively distended and firm. Bowel sounds present. No organomegaly or masses I could appreciate, except for umbilical hernia. Umbilical hernia protruding approximately  4 inches from umbilicus with ulcerated skin and some areas of bleeding and edema/inflammation,  very poor smell, erythema to the right side of umbilical hernia extending to the flank EXTREMITIES: No pedal edema, cyanosis, or clubbing.  NEUROLOGIC: Cranial nerves II through XII difficult to evaluate this patient is sedated with medications here in the emergency room and very somnolent. Muscle strength ,  Sensation difficult to evaluate. Gait not checked.  PSYCHIATRIC: The patient is somnolent and disoriented.  SKIN: Umbilical hernia protruding approximately  4 inches from umbilicus with ulcerated skin and some areas of bleeding and edema/inflammation, very poor smell, erythema to the right side of umbilical hernia extending to the flank.   LABORATORY PANEL:   CBC  Recent Labs Lab 11/30/14 1331  WBC 7.9  HGB 8.1*  HCT 26.1*  PLT 150  MCV 79.6*  MCH 24.8*  MCHC 31.1*  RDW 22.1*  LYMPHSABS 0.7*  MONOABS 1.5*  EOSABS 0.0  BASOSABS 0.1   ------------------------------------------------------------------------------------------------------------------  Chemistries   Recent Labs Lab 11/30/14 1331  NA 129*  K 3.5  CL 95*  CO2 27  GLUCOSE 116*  BUN 9  CREATININE 0.59  CALCIUM 7.7*  MG 1.9  AST 63*  ALT 24  ALKPHOS 93  BILITOT 1.8*   ------------------------------------------------------------------------------------------------------------------  Cardiac Enzymes No results for input(s): TROPONINI in the last 168 hours. ------------------------------------------------------------------------------------------------------------------  RADIOLOGY: No results found.  EKG: No orders found for this or any previous visit.  IMPRESSION AND PLAN:  Active Problems:   Umbilical hernia   Liver cirrhosis   Ascites   Coagulopathy   Hyponatremia   Anemia   Jaundice   Cellulitis 1. Extensive abdominal wall cellulitis, initiate patient on vancomycin and Zosyn. Get wound cultures as well as blood cultures, follow clinically. Get wound care involved. Initiate topical  bacitracin. Get palliative care involved to discuss this family,  power of attorney for medical care 2. Umbilical hernia as above. Will initiate patient on the bacitracin as well as antibiotics and patient is very high risk for surgical management as per Dr. Excell Seltzer 3. Coagulopathy due to liver cirrhosis, give patient vitamin K IV. Follow patient's ProTime tomorrow morning 4. Hyponatremia, get ultrasound of abdomen to rule out ascites. Initiate patient on Lasix as well as spironolactone. Follow sodium level in the morning 5. Anemia, guaiac. Start patient on nadolol and Protonix 6. Severe protein calorie malnutrition. Get dietary involved for further recommendations   All the records are reviewed and case discussed with ED provider. Management plans discussed with the patient, family and they are in agreement.  CODE STATUS:  Full code  TOTAL TIME TAKING CARE OF THIS PATIENT: 60 minutes minutes.   Attempted to reach palliative care. No response to prolonged discussion approximately 10-15 minutes with emergency room physician about her treatment plan and admission related issues.  Katharina Caper M.D on 11/30/2014 at 5:28 PM  Between 7am to 6pm - Pager - [phone] After 6pm go to [email or web address] - password [email or web address] [facility]  [facility]  Office  [phone]  CC: Primary care physician; Jaclyn Shaggy, MD

## 2014-11-30 NOTE — ED Provider Notes (Signed)
Time Seen: Approximately 1500 I have reviewed the triage notes  Chief Complaint: Cirrhosis   History of Present Illness: Stephanie Beck is a 59 y.o. female who was brought in from the parking lot and is with police. Patient apparently is turned over power of attorney to a friend who determine if patient needs medical management. The patient's primary physician has been trying to get her in for not only laboratory work and treatment but also correction of a large umbilical hernia. Also has a history of psychiatric disorders such as panic attacks, bipolar affective disorder, etc. The power of attorney has stated that he would like her to have medical management and treatment and possible future surgery for umbilical hernia. The patient was IVC shortly after my conversation so she could not leave and had attempted on multiple occasions to leave the emergency department. Patient apparently has a long history of heavy alcohol abuse. The neighbor who has the power of attorney says he's witnessed her drinking approximately a half gallon of hard every 4 days. He states she eats little other than drinking the alcohol. She essentially lives alone and her house is a [place] area "". Reviewed the patient's medication so that she's been on methadone for chronic pain. Other than pain around her umbilicus patient herself has no physical complaints. She denies any current hallucinations.   Past Medical History  Diagnosis Date  . Panic attack   . Hepatitis C 1995  . Bipolar affective   . Chronic pain     legs  . Heart murmur   . Hernia, umbilical     Protruding    Patient Active Problem List   Diagnosis Date Noted  . Gallbladder polyp 11/27/2012    Past Surgical History  Procedure Laterality Date  . Breast surgery  2000  . Liver biopsy  2005    Past Surgical History  Procedure Laterality Date  . Breast surgery  2000  . Liver biopsy  2005    Current Outpatient Rx  Name  Route  Sig   Dispense  Refill  . ALPRAZolam (XANAX) 1 MG tablet   Oral   Take 1 mg by mouth 2 (two) times daily.         Marland Kitchen amphetamine-dextroamphetamine (ADDERALL) 30 MG tablet   Oral   Take 30 mg by mouth daily.         . methadone (DOLOPHINE) 10 MG tablet   Oral   Take 20 mg by mouth 3 (three) times daily.          Marland Kitchen triamcinolone cream (KENALOG) 0.1 %   Topical   Apply 1 application topically 2 (two) times daily.           Allergies:  Latex  Family History: Family History  Problem Relation Age of Onset  . Heart attack Father   . Stroke Mother     Social History: Social History  Substance Use Topics  . Smoking status: Current Every Day Smoker -- 1.00 packs/day for 12 years  . Smokeless tobacco: Never Used  . Alcohol Use: Yes     Review of Systems:   10 point review of systems was performed and was otherwise negative:  Constitutional: No fever Eyes: No visual disturbances ENT: No sore throat, ear pain Cardiac: No chest pain Respiratory: No shortness of breath, wheezing, or stridor Abdomen: No abdominal pain, no vomiting, No diarrhea Endocrine: No weight loss, No night sweats Extremities: No peripheral edema, cyanosis Skin: No rashes, easy  bruising Neurologic: No focal weakness, trouble with speech or swollowing Urologic: No dysuria, Hematuria, or urinary frequency   Physical Exam:  ED Triage Vitals  Enc Vitals Group     BP 11/30/14 1324 149/84 mmHg     Pulse Rate 11/30/14 1324 122     Resp 11/30/14 1324 18     Temp 11/30/14 1324 99.2 F (37.3 C)     Temp Source 11/30/14 1324 Oral     SpO2 11/30/14 1324 100 %     Weight 11/30/14 1324 132 lb 11.2 oz (60.192 kg)     Height 11/30/14 1324  (1.6 m)     Head Cir --      Peak Flow --      Pain Score 11/30/14 1438 Asleep     Pain Loc --      Pain Edu? --      Excl. in GC? --     General: Awake , Alert , and Oriented times 2 very  uncooperative with history and physical exam. Head: Normal cephalic  , atraumatic Eyes: Pupils equal , round, reactive to light Nose/Throat: No nasal drainage, patent upper airway without erythema or exudate.  Dry mucous membranes Neck: Supple, Full range of motion, No anterior adenopathy or palpable thyroid masses Lungs: Clear to ascultation without wheezes , rhonchi, or rales Heart: Regular rate, regular rhythm with a grade 3 systolic ejection murmur left sternal border , no gallops , or rubs Abdomen: Large umbilical hernia with protrusion of omentum. There is a surrounding cellulitic area approximately 8 cm circular around the area of the umbilicus. No obvious palpable induration or crepitus is noted. Sounds are symmetric but diminished in all 4 quadrants.       Extremities: 2 plus symmetric pulses. No edema, clubbing or cyanosis Neurologic: normal ambulation, Motor symmetric without deficits, sensory intact Skin: warm, dry, no rashes   Labs:   All laboratory work was reviewed including any pertinent negatives or positives listed below:  Labs Reviewed  CBC WITH DIFFERENTIAL/PLATELET - Abnormal; Notable for the following:    RBC 3.29 (*)    Hemoglobin 8.1 (*)    HCT 26.1 (*)    MCV 79.6 (*)    MCH 24.8 (*)    MCHC 31.1 (*)    RDW 22.1 (*)    Lymphs Abs 0.7 (*)    Monocytes Absolute 1.5 (*)    All other components within normal limits  COMPREHENSIVE METABOLIC PANEL - Abnormal; Notable for the following:    Sodium 129 (*)    Chloride 95 (*)    Glucose, Bld 116 (*)    Calcium 7.7 (*)    Total Protein 6.0 (*)    Albumin 2.1 (*)    AST 63 (*)    Total Bilirubin 1.8 (*)    All other components within normal limits  PROTIME-INR - Abnormal; Notable for the following:    Prothrombin Time 18.2 (*)    All other components within normal limits  APTT  LIPASE, BLOOD  ETHANOL  MAGNESIUM  URINE DRUG SCREEN, QUALITATIVE ([facility] ONLY)  TYPE AND SCREEN  ABO/RH    EKG: None    Radiology: None    Procedures: None   Critical Care: None    ED  Course:  Patient received Haldol due to agitation so that we could perform laboratory work and establish IV access. The patient most likely will require medical treatment prior to receiving any kind of surgery for umbilical hernia which appears is been  existent for an extensive period of time. She was started on Zosyn for this surrounding erythema which I assume is cellulitic at this time. The patient does not appear to have an acute bowel obstruction. Review of the laboratory work shows an elevated prothrombin time which will likely need some correction prior to any surgery. Certainly surgical consultation is appropriate. Pending studies include urine drug screen, etc. Patient also has anemia likely secondary to long-term alcohol abuse. He is not currently at a level to be transfused and her anemia seems to be a chronic issue and there was no significant change from her last hemoglobin. IVC paperwork is been filled out due to the patient being a danger to herself. Involuntary commitment paperwork was filled out with consent from the patient's power of attorney Mr. Devonne Doughty   Assessment:  Alcohol abuse Elevated prothrombin time likely secondary to above. Involuntary commitment  Final Clinical Impression:  Final diagnoses:  Alcohol abuse  Cellulitis of abdominal wall     Plan: Patient's case was reviewed with the hospitalist team, further disposition and management depends upon their evaluation.            [name], MD 11/30/14 [phone]

## 2014-11-30 NOTE — Consult Note (Signed)
Surgical Consultation  11/30/2014  Stephanie Beck is an 59 y.o. female.   CC: Umbilical hernia  HPI: This is a patient whose history is reviewed with the emergency room physician and the chart. He is not able to give any sort of history nor she a capable of providing a review of systems.  Apparently she is an alcoholic with hepatitis C and considerable cirrhosis who has had an umbilical hernia which is obviously incarcerated and has eroded through the skin making it a long-standing process.  Past Medical History  Diagnosis Date  . Panic attack   . Hepatitis C 1995  . Bipolar affective   . Chronic pain     legs  . Heart murmur   . Hernia, umbilical     Protruding    Past Surgical History  Procedure Laterality Date  . Breast surgery  2000  . Liver biopsy  2005    Family History  Problem Relation Age of Onset  . Heart attack Father   . Stroke Mother     Social History:  reports that she has been smoking.  She has never used smokeless tobacco. She reports that she drinks alcohol. She reports that she does not use illicit drugs.  Allergies:  Allergies  Allergen Reactions  . Latex Itching    Medications reviewed.   Review of Systems:   Review of Systems  Unable to perform ROS: mental acuity     Physical Exam:  BP 115/80 mmHg  Pulse 97  Temp(Src) 99.2 F (37.3 C) (Oral)  Resp 16  Ht 5' 3"  (1.6 m)  Wt 132 lb 11.2 oz (60.192 kg)  BMI 23.51 kg/m2  SpO2 99%  Physical Exam  Constitutional:  Disheveled noncommunicative mildly jaundiced  HENT:  Head: Normocephalic.  Eyes: Pupils are equal, round, and reactive to light. Scleral icterus is present.  Neck: Normal range of motion. Neck supple.  Cardiovascular: Normal rate, regular rhythm and normal heart sounds.   Pulmonary/Chest: Effort normal. No respiratory distress. She has wheezes. She has no rales.  Abdominal: Soft. She exhibits no distension. There is no rebound and no guarding.  Obvious erosion of  the skin of the umbilical hernia with incarcerated omentum protruding approximately 4 inches is edematous but viable and starting to granulate. There is a clean Kleenex dressing over the omentum. There is erythema around the abdomen but the abdomen is minimally tender without peritoneal signs there is a suggestion of a caput medusa  Musculoskeletal: Normal range of motion.  Lymphadenopathy:    She has no cervical adenopathy.  Neurological: She is alert.  Patient is noncommunicative but is alert  Skin: Skin is warm and dry.  Jaundiced      Results for orders placed or performed during the hospital encounter of 11/30/14 (from the past 48 hour(s))  CBC with Differential/Platelet     Status: Abnormal   Collection Time: 11/30/14  1:31 PM  Result Value Ref Range   WBC 7.9 3.6 - 11.0 K/uL   RBC 3.29 (L) 3.80 - 5.20 MIL/uL   Hemoglobin 8.1 (L) 12.0 - 16.0 g/dL   HCT 26.1 (L) 35.0 - 47.0 %   MCV 79.6 (L) 80.0 - 100.0 fL   MCH 24.8 (L) 26.0 - 34.0 pg   MCHC 31.1 (L) 32.0 - 36.0 g/dL   RDW 22.1 (H) 11.5 - 14.5 %   Platelets 150 150 - 440 K/uL   Neutrophils Relative % 70 %   Neutro Abs 5.6 1.4 - 6.5 K/uL  Lymphocytes Relative 9 %   Lymphs Abs 0.7 (L) 1.0 - 3.6 K/uL   Monocytes Relative 20 %   Monocytes Absolute 1.5 (H) 0.2 - 0.9 K/uL   Eosinophils Relative 0 %   Eosinophils Absolute 0.0 0 - 0.7 K/uL   Basophils Relative 1 %   Basophils Absolute 0.1 0 - 0.1 K/uL  Comprehensive metabolic panel     Status: Abnormal   Collection Time: 11/30/14  1:31 PM  Result Value Ref Range   Sodium 129 (L) 135 - 145 mmol/L   Potassium 3.5 3.5 - 5.1 mmol/L   Chloride 95 (L) 101 - 111 mmol/L   CO2 27 22 - 32 mmol/L   Glucose, Bld 116 (H) 65 - 99 mg/dL   BUN 9 6 - 20 mg/dL   Creatinine, Ser 0.59 0.44 - 1.00 mg/dL   Calcium 7.7 (L) 8.9 - 10.3 mg/dL   Total Protein 6.0 (L) 6.5 - 8.1 g/dL   Albumin 2.1 (L) 3.5 - 5.0 g/dL   AST 63 (H) 15 - 41 U/L   ALT 24 14 - 54 U/L   Alkaline Phosphatase 93 38 - 126  U/L   Total Bilirubin 1.8 (H) 0.3 - 1.2 mg/dL   GFR calc non Af Amer >60 >60 mL/min   GFR calc Af Amer >60 >60 mL/min    Comment: (NOTE) The eGFR has been calculated using the CKD EPI equation. This calculation has not been validated in all clinical situations. eGFR's persistently <60 mL/min signify possible Chronic Kidney Disease.    Anion gap 7 5 - 15  Protime-INR     Status: Abnormal   Collection Time: 11/30/14  1:31 PM  Result Value Ref Range   Prothrombin Time 18.2 (H) 11.4 - 15.0 seconds   INR 1.49   APTT     Status: None   Collection Time: 11/30/14  1:31 PM  Result Value Ref Range   aPTT 31 24 - 36 seconds  Type and screen     Status: None   Collection Time: 11/30/14  1:31 PM  Result Value Ref Range   ABO/RH(D) AB NEG    Antibody Screen NEG    Sample Expiration 12/03/2014   Lipase, blood     Status: None   Collection Time: 11/30/14  1:31 PM  Result Value Ref Range   Lipase 31 22 - 51 U/L  Ethanol     Status: None   Collection Time: 11/30/14  1:31 PM  Result Value Ref Range   Alcohol, Ethyl (B) <5 <5 mg/dL    Comment:        LOWEST DETECTABLE LIMIT FOR SERUM ALCOHOL IS 5 mg/dL FOR MEDICAL PURPOSES ONLY   Magnesium     Status: None   Collection Time: 11/30/14  1:31 PM  Result Value Ref Range   Magnesium 1.9 1.7 - 2.4 mg/dL  ABO/Rh     Status: None   Collection Time: 11/30/14  1:32 PM  Result Value Ref Range   ABO/RH(D) AB NEG    No results found.  Assessment/Plan:  This a patient with a long-standing incarcerated omentum within an umbilical hernia. This been going on so long that it has eroded through the skin of the umbilicus and is essentially plugging her abdomen so that no ascites is leaking through the abdomen. There appears to be early granulation tissue on the omentum itself. She does have some erythema over the abdomen but not involving the omentum. 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 This been going on so long that there is granulation tissue on the omentum and this suggests both a long-standing chronic nature of this and keeping this in mind she is at an extremely poor surgical candidate and her risk is extreme. My recommendations would be for a bacitracin dressing twice a day. She is being admitted by the psych service as well as internal medicine but I see no surgical indication at this point because of her limitations of performance and severe alcoholism and hepatitis C. Discussed with the emergency room physician.  Florene Glen, MD, FACS

## 2014-12-01 DIAGNOSIS — K429 Umbilical hernia without obstruction or gangrene: Secondary | ICD-10-CM

## 2014-12-01 LAB — CBC
HEMATOCRIT: 23.6 % — AB (ref 35.0–47.0)
Hemoglobin: 7.3 g/dL — ABNORMAL LOW (ref 12.0–16.0)
MCH: 24.7 pg — AB (ref 26.0–34.0)
MCHC: 31 g/dL — ABNORMAL LOW (ref 32.0–36.0)
MCV: 79.6 fL — AB (ref 80.0–100.0)
PLATELETS: 115 10*3/uL — AB (ref 150–440)
RBC: 2.96 MIL/uL — AB (ref 3.80–5.20)
RDW: 21.9 % — AB (ref 11.5–14.5)
WBC: 5.4 10*3/uL (ref 3.6–11.0)

## 2014-12-01 LAB — URINE DRUG SCREEN, QUALITATIVE (ARMC ONLY)
Amphetamines, Ur Screen: POSITIVE — AB
BARBITURATES, UR SCREEN: NOT DETECTED
Benzodiazepine, Ur Scrn: POSITIVE — AB
COCAINE METABOLITE, UR ~~LOC~~: NOT DETECTED
Cannabinoid 50 Ng, Ur ~~LOC~~: NOT DETECTED
MDMA (Ecstasy)Ur Screen: NOT DETECTED
METHADONE SCREEN, URINE: POSITIVE — AB
Opiate, Ur Screen: NOT DETECTED
Phencyclidine (PCP) Ur S: NOT DETECTED
TRICYCLIC, UR SCREEN: NOT DETECTED

## 2014-12-01 LAB — COMPREHENSIVE METABOLIC PANEL
ALT: 18 U/L (ref 14–54)
AST: 43 U/L — AB (ref 15–41)
Albumin: 1.7 g/dL — ABNORMAL LOW (ref 3.5–5.0)
Alkaline Phosphatase: 75 U/L (ref 38–126)
Anion gap: 4 — ABNORMAL LOW (ref 5–15)
BUN: 8 mg/dL (ref 6–20)
CHLORIDE: 101 mmol/L (ref 101–111)
CO2: 27 mmol/L (ref 22–32)
CREATININE: 0.56 mg/dL (ref 0.44–1.00)
Calcium: 7.6 mg/dL — ABNORMAL LOW (ref 8.9–10.3)
GFR calc Af Amer: >60 mL/min (ref >60)
GFR calc non Af Amer: >60 mL/min (ref >60)
Glucose, Bld: 64 mg/dL — ABNORMAL LOW (ref 65–99)
Potassium: 3.7 mmol/L (ref 3.5–5.1)
SODIUM: 132 mmol/L — AB (ref 135–145)
Total Bilirubin: 2.1 mg/dL — ABNORMAL HIGH (ref 0.3–1.2)
Total Protein: 5.2 g/dL — ABNORMAL LOW (ref 6.5–8.1)

## 2014-12-01 LAB — PROTIME-INR
INR: 1.68
Prothrombin Time: 20 seconds — ABNORMAL HIGH (ref 11.4–15.0)

## 2014-12-01 MED ORDER — CETYLPYRIDINIUM CHLORIDE 0.05 % MT LIQD
7.0000 mL | Freq: Two times a day (BID) | OROMUCOSAL | Status: DC
  Administered 2014-12-01: 7 mL via OROMUCOSAL

## 2014-12-01 MED ORDER — CHLORHEXIDINE GLUCONATE 0.12 % MT SOLN
15.0000 mL | Freq: Two times a day (BID) | OROMUCOSAL | Status: DC
  Administered 2014-12-01 (×2): 15 mL via OROMUCOSAL
  Filled 2014-12-01 (×2): qty 15

## 2014-12-01 MED ORDER — ALPRAZOLAM 0.5 MG PO TABS: 0.5000 mg | ORAL_TABLET | Freq: Two times a day (BID) | ORAL | Status: DC | PRN

## 2014-12-01 MED ORDER — BACITRACIN ZINC 500 UNIT/GM EX OINT
TOPICAL_OINTMENT | Freq: Two times a day (BID) | CUTANEOUS | Status: DC
  Administered 2014-12-01: 1 via TOPICAL
  Filled 2014-12-01: qty 28.4

## 2014-12-01 MED ORDER — ENSURE ENLIVE PO LIQD
237.0000 mL | Freq: Two times a day (BID) | ORAL | Status: DC
  Administered 2014-12-01: 237 mL via ORAL

## 2014-12-01 MED ORDER — METHADONE HCL 10 MG PO TABS
15.0000 mg | ORAL_TABLET | Freq: Two times a day (BID) | ORAL | Status: DC
  Administered 2014-12-01 – 2014-12-02 (×3): 15 mg via ORAL
  Filled 2014-12-01 (×3): qty 2

## 2014-12-01 NOTE — Progress Notes (Signed)
Patient ID: Stephanie Beck, female   DOB: [birth year], 59 y.o.   MRN: [id number] [facility] PROGRESS NOTE  PCP: Jaclyn Shaggy, MD  HPI/Subjective: The patient would like to go home because she has things to do. Unfortunately she is under involuntary commitment. My medical opinion she still has abdominal wall cellulitis that needs further treatment with antibiotics. The patient feels okay. No abdominal pain.  Objective: Filed Vitals:   12/01/14 0818  BP: 104/48  Pulse: 101  Temp: 99.1 F (37.3 C)  Resp: 16    Filed Weights   11/30/14 1324 11/30/14 1936  Weight: 60.192 kg (132 lb 11.2 oz) 61.236 kg (135 lb)    ROS: Review of Systems  Constitutional: Negative for fever and chills.  Eyes: Negative for blurred vision.  Respiratory: Negative for cough and shortness of breath.   Cardiovascular: Negative for chest pain.  Gastrointestinal: Negative for nausea, vomiting, abdominal pain, diarrhea and constipation.  Genitourinary: Negative for dysuria.  Musculoskeletal: Positive for myalgias. Negative for joint pain.  Neurological: Negative for dizziness and headaches.   Exam: Physical Exam  Constitutional: She is oriented to person, place, and time.  HENT:  Nose: No mucosal edema.  Mouth/Throat: No oropharyngeal exudate or posterior oropharyngeal edema.  Eyes: Conjunctivae, EOM and lids are normal. Pupils are equal, round, and reactive to light.  Neck: No JVD present. Carotid bruit is not present. No edema present. No thyroid mass and no thyromegaly present.  Cardiovascular: S1 normal and S2 normal.  Exam reveals no gallop.   No murmur heard. Pulses:      Dorsalis pedis pulses are 2+ on the right side, and 2+ on the left side.  Respiratory: No respiratory distress. She has no wheezes. She has no rhonchi. She has no rales.  GI: Soft. Bowel sounds are normal. She exhibits distension. There is no tenderness.  Umbilical hernia with protruding omentum coming out at  least 5 inches from the opening of the umbilical hernia.  Musculoskeletal:       Right ankle: She exhibits no swelling.       Left ankle: She exhibits no swelling.  Lymphadenopathy:    She has no cervical adenopathy.  Neurological: She is alert and oriented to person, place, and time. No cranial nerve deficit.  Skin: Skin is warm. No rash noted. Nails show no clubbing.  Psychiatric: She has a normal mood and affect.    Data Reviewed: Basic Metabolic Panel:  Recent Labs Lab 11/30/14 1331 12/01/14 0434  NA 129* 132*  K 3.5 3.7  CL 95* 101  CO2 27 27  GLUCOSE 116* 64*  BUN 9 8  CREATININE 0.59 0.56  CALCIUM 7.7* 7.6*  MG 1.9  --    Liver Function Tests:  Recent Labs Lab 11/30/14 1331 12/01/14 0434  AST 63* 43*  ALT 24 18  ALKPHOS 93 75  BILITOT 1.8* 2.1*  PROT 6.0* 5.2*  ALBUMIN 2.1* 1.7*    Recent Labs Lab 11/30/14 1331  LIPASE 31    Recent Labs Lab 11/30/14 1908  AMMONIA 64*   CBC:  Recent Labs Lab 11/30/14 1331 12/01/14 0434  WBC 7.9 5.4  NEUTROABS 5.6  --   HGB 8.1* 7.3*  HCT 26.1* 23.6*  MCV 79.6* 79.6*  PLT 150 115*     Recent Results (from the past 240 hour(s))  Culture, blood (routine x 2)     Status: None (Preliminary result)   Collection Time: 11/30/14  7:52 PM  Result Value Ref Range  Status   Specimen Description BLOOD RIGHT ARM  Final   Special Requests BOTTLES DRAWN AEROBIC AND ANAEROBIC 8CC  Final   Culture NO GROWTH < 12 HOURS  Final   Report Status PENDING  Incomplete  Culture, blood (routine x 2)     Status: None (Preliminary result)   Collection Time: 11/30/14  7:57 PM  Result Value Ref Range Status   Specimen Description BLOOD LEFT HAND  Final   Special Requests BOTTLES DRAWN AEROBIC AND ANAEROBIC 7CC  Final   Culture NO GROWTH < 12 HOURS  Final   Report Status PENDING  Incomplete     Studies: US Abdomen Limited  11/30/2014   CLINICAL DATA:  Abdominal distention.  Ascites.  EXAM: LIMITED ABDOMEN ULTRASOUND FOR  ASCITES  TECHNIQUE: Limited ultrasound survey for ascites was performed in all four abdominal quadrants.  COMPARISON:  CT abdomen 10/14/2012  FINDINGS: Limited ultrasound imaging of the 4 quadrants of the abdomen was obtained. Moderate free fluid is demonstrated in all 4 quadrants consistent with diffuse ascites. The visualized liver demonstrates a nodular contour suggesting cirrhosis.  IMPRESSION: Positive examination for moderate abdominal ascites in 4 quadrants.   Electronically Signed   By: Burman Nieves M.D.   On: 11/30/2014 18:27    Scheduled Meds: . amphetamine-dextroamphetamine  30 mg Oral Q breakfast  . antiseptic oral rinse  7 mL Mouth Rinse q12n4p  . bacitracin   Topical BID  . chlorhexidine  15 mL Mouth Rinse BID  . methadone  15 mg Oral BID  . nadolol  10 mg Oral Daily  . pantoprazole  40 mg Oral Daily  . piperacillin-tazobactam (ZOSYN)  IV  3.375 g Intravenous 3 times per day  . vancomycin  500 mg Intravenous Q12H   Continuous Infusions:   Assessment/Plan:  1. Abdominal wall cellulitis, umbilical hernia with protruding omentum 5 inches out from the perforated abdominal hernia. Surgery does not want to operate. Continue IV antibiotics vancomycin and Zosyn. Overall prognosis is poor. 2. Patient was involuntarily committed from the ER, the patient will need a psychiatric clearance before she can sign out AGAINST MEDICAL ADVICE. 3. Cirrhosis, hepatitis C, coagulopathy, malnutrition, elevated liver function tests, thrombocytopenia- overall prognosis poor. Patient is on nadolol. 4. Anemia of chronic disease- patient may end up needing a transfusion during his hospital course. 5. Hyponatremia likely also secondary to liver disease. 6. Chronic pain takes methadone 15 mg twice a day.  Code Status:     Code Status Orders        Start     Ordered   11/30/14 1938  Full code   Continuous     11/30/14 1937    Advance Directive Documentation        Most Recent Value   Type of  Advance Directive  Healthcare Power of Attorney   Pre-existing out of facility DNR order (yellow form or pink MOST form)     "MOST" Form in Place?       Disposition Plan: Patient will likely sign out AGAINST MEDICAL ADVICE once cleared by psychiatry to do so.  Consultants:  Surgery  Psychiatry  Palliative care  Time spent: 25 minutes  Alford Highland  [facility]

## 2014-12-01 NOTE — Progress Notes (Signed)
CC: Incarcerated umbilical hernia Subjective: Chronically incarcerated umbilical hernia with erosion of the skin protrusion of omentum through the hernia. This is a chronic condition at this point and has been present for a very long time.  Objective: Vital signs in last 24 hours: Temp:  [98.2 F (36.8 C)-99.1 F (37.3 C)] 98.2 F (36.8 C) (08/23 1651) Pulse Rate:  [81-101] 81 (08/23 1651) Resp:  [16-20] 16 (08/23 0818) BP: (104-120)/(48-75) 116/70 mmHg (08/23 1651) SpO2:  [96 %-100 %] 96 % (08/23 1651) FiO2 (%):  [21 %] 21 % (08/22 1938) Weight:  [135 lb (61.236 kg)] 135 lb (61.236 kg) (08/22 1936) Last BM Date: 11/30/14  Intake/Output from previous day: 08/22 0701 - 08/23 0700 In: 0  Out: 800 [Urine:800] Intake/Output this shift: Total I/O In: 508.5 [P.O.:360; IV Piggyback:148.5] Out: 350 [Urine:350]  Physical exam:  More awake and alert today and actually conversant.  Abdomen shows improved cellulitis and erythema otherwise soft and nontender long-standing incarceration of umbilical hernia with omentum protruding through skin with granulation tissue present.  Lab Results: CBC   Recent Labs  11/30/14 1331 12/01/14 0434  WBC 7.9 5.4  HGB 8.1* 7.3*  HCT 26.1* 23.6*  PLT 150 115*   BMET  Recent Labs  11/30/14 1331 12/01/14 0434  NA 129* 132*  K 3.5 3.7  CL 95* 101  CO2 27 27  GLUCOSE 116* 64*  BUN 9 8  CREATININE 0.59 0.56  CALCIUM 7.7* 7.6*   PT/INR  Recent Labs  11/30/14 1331 12/01/14 0434  LABPROT 18.2* 20.0*  INR 1.49 1.68   ABG No results for input(s): PHART, HCO3 in the last 72 hours.  Invalid input(s): PCO2, PO2  Studies/Results: US Abdomen Limited  11/30/2014   CLINICAL DATA:  Abdominal distention.  Ascites.  EXAM: LIMITED ABDOMEN ULTRASOUND FOR ASCITES  TECHNIQUE: Limited ultrasound survey for ascites was performed in all four abdominal quadrants.  COMPARISON:  CT abdomen 10/14/2012  FINDINGS: Limited ultrasound imaging of the 4  quadrants of the abdomen was obtained. Moderate free fluid is demonstrated in all 4 quadrants consistent with diffuse ascites. The visualized liver demonstrates a nodular contour suggesting cirrhosis.  IMPRESSION: Positive examination for moderate abdominal ascites in 4 quadrants.   Electronically Signed   By: Burman Nieves M.D.   On: 11/30/2014 18:27    Anti-infectives: Anti-infectives    Start     Dose/Rate Route Frequency Ordered Stop   12/01/14 0600  vancomycin (VANCOCIN) 500 mg in sodium chloride 0.9 % 100 mL IVPB     500 mg 100 mL/hr over 60 Minutes Intravenous Every 12 hours 11/30/14 1820     11/30/14 2130  piperacillin-tazobactam (ZOSYN) IVPB 3.375 g     3.375 g 12.5 mL/hr over 240 Minutes Intravenous 3 times per day 11/30/14 1816     11/30/14 1830  vancomycin (VANCOCIN) IVPB 750 mg/150 ml premix     750 mg 150 mL/hr over 60 Minutes Intravenous STAT 11/30/14 1820 11/30/14 2344   11/30/14 1530  piperacillin-tazobactam (ZOSYN) IVPB 3.375 g     3.375 g 12.5 mL/hr over 240 Minutes Intravenous  Once 11/30/14 1517 11/30/14 1728      Assessment/Plan:  Patient with relative thrombocytopenia coagulopathy cirrhosis ascites who exhibits signs of a chronic incarceration of the omentum of the umbilicus. The skin of the umbilicus is completely gone and there is some cellulitis that is improving. At this point I again relay the message that this patient is an extremely poor surgical candidate. If this were  an acute process surgical intervention may be necessary but in this patient who is experienced a chronic process of long-standing nature I would not recommend surgical intervention because of the risk of bleeding from the mild caput Medusa and portal venous hypertension. I will follow the patient while she is in the hospital on her involuntary commitment.  Lattie Haw, MD, FACS  12/01/2014

## 2014-12-01 NOTE — Consult Note (Signed)
WOC wound consult note Reason for Consult:Umbilical hernia with protruding omentum coming out at least 5 inches from the opening of the umbilical hernia.  Wound type:Chronic incarcerated umbilical hernia.  Erythema and tenderness surrounding hernia and umbilicus.  Surgery has evaluated and does not feel like surgery is indicated at this time due to overall poor health and chronic nature of hernia.  Pressure Ulcer POA: N/A Measurement: 3 cm x 3 cm opening with protruding omentum.  Pale white patches present on omentum.  Drainage (amount, consistency, odor) NOne Periwound:Erythema and tenderness Dressing procedure/placement/frequency:Keep hernia moist and covered.  Bacitracin ointment to lesions and cover with vaseline gauze.  Secure with ABD pad and tape.  Change BID.  Will not follow at this time.  Please re-consult if needed.  Maple Hudson RN BSN CWON Pager [phone]

## 2014-12-01 NOTE — Consult Note (Signed)
[facility] Face-to-Face Psychiatry Consult   Reason for Consult:  Consult for this 59 year old woman with a history of alcohol abuse. She was placed under commitment in the emergency room. Referring Physician:  Marcelene Butte Patient Identification: Stephanie Beck MRN:  [id number] Principal Diagnosis: Alcohol abuse Diagnosis:   Patient Active Problem List   Diagnosis Date Noted  . Umbilical hernia [B34.1] 11/30/2014  . Liver cirrhosis [K74.60] 11/30/2014  . Ascites [R18.8] 11/30/2014  . Coagulopathy [D68.9] 11/30/2014  . Hyponatremia [E87.1] 11/30/2014  . Jaundice [R17] 11/30/2014  . Anemia [D64.9] 11/30/2014  . Cellulitis [L03.90] 11/30/2014  . Alcohol abuse [F10.10]   . Cellulitis of abdominal wall [L03.311]   . Gallbladder polyp [K82.4] 11/27/2012    Total Time spent with patient: 1 hour  Subjective:   Stephanie Beck is a 59 y.o. female patient admitted with "some friend thought I need to be in the hospital".  HPI:  Information from the patient and the chart. This is a 40 year old woman with a history of alcohol abuse. A friend brought her to the hospital because she was apparently getting more and more confused and they discovered that she had a hernia that had actually broken through the skin of her abdomen. Patient was resistant to hospitalization initially and was placed under petition in the emergency room and given some sedatives. Since then she has been treated for cellulitis. Surgery was consulted and feels that she is not a safe surgical candidate at this time. Consult with me is because of the commitment petition being in place and the alcohol abuse. Patient admits that she's been back to drinking again. She refuses to come up with an estimate of how much she is been drinking that she had a blood alcohol level over 300 when she came in. That her mood has been fine. Denies that she's been depressed. Totally denies suicidal ideation. Denies hallucinations. She tends to minimize the  degree of her physical problems and talks about how she just needs to take care of some things at home. She does however express an understanding that she has cirrhosis and that alcohol abuse worsens the cirrhosis and that the whole condition can potentially lead to death.  Past psychiatric history: Patient remains somewhat vague in her history but it sounds like she developed alcohol abuse as an adult. Has been to rehabilitation on at least one occasion which was court ordered. Says that she's been able to stop drinking for about a year at a time but was many years ago that she did that. Denies any history of suicide attempts. She does say that she has "bipolar" but doesn't have any more details to offer than that. We don't have records of hospitalization in our notes.  Medical history: Patient has an umbilical hernia that is extruded through the skin. Liver cirrhosis. Ascites. Severe coagulopathy and hyponatremia and cellulitis.  Family history: Positive for alcohol abuse  Social history: Evidently lives by herself. She is quite pleased that she now gets disability and recently managed to get her most recent DWI charge taking care of.  Medications: She says that she is prescribed medicines. It looks like Dr. Hall Busing has been giving her some Xanax methadone and occasionally Adderall for unknown reasons. HPI Elements:   Quality:  Alcohol abuse with cirrhosis. Severity:  Severe and. Timing:  Severe and life-threatening. Duration:  Gotten worse in the last couple weeks long-standing problem.. Context:  Alcohol abuse and untreated medical problems.  Past Medical History:  Past Medical History  Diagnosis Date  . Panic attack   . Hepatitis C 1995  . Bipolar affective   . Chronic pain     legs  . Heart murmur   . Hernia, umbilical     Protruding    Past Surgical History  Procedure Laterality Date  . Breast surgery  2000  . Liver biopsy  2005   Family History:  Family History  Problem  Relation Age of Onset  . Heart attack Father   . Stroke Mother    Social History:  History  Alcohol Use  . Yes     History  Drug Use No    Social History   Social History  . Marital Status: Married    Spouse Name: N/A  . Number of Children: N/A  . Years of Education: N/A   Social History Main Topics  . Smoking status: Current Every Day Smoker -- 1.00 packs/day for 12 years  . Smokeless tobacco: Never Used  . Alcohol Use: Yes  . Drug Use: No  . Sexual Activity: Not Asked   Other Topics Concern  . None   Social History Narrative   Additional Social History:                          Allergies:   Allergies  Allergen Reactions  . Latex Itching    Labs:  Results for orders placed or performed during the hospital encounter of 11/30/14 (from the past 48 hour(s))  CBC with Differential/Platelet     Status: Abnormal   Collection Time: 11/30/14  1:31 PM  Result Value Ref Range   WBC 7.9 3.6 - 11.0 K/uL   RBC 3.29 (L) 3.80 - 5.20 MIL/uL   Hemoglobin 8.1 (L) 12.0 - 16.0 g/dL   HCT 26.1 (L) 35.0 - 47.0 %   MCV 79.6 (L) 80.0 - 100.0 fL   MCH 24.8 (L) 26.0 - 34.0 pg   MCHC 31.1 (L) 32.0 - 36.0 g/dL   RDW 22.1 (H) 11.5 - 14.5 %   Platelets 150 150 - 440 K/uL   Neutrophils Relative % 70 %   Neutro Abs 5.6 1.4 - 6.5 K/uL   Lymphocytes Relative 9 %   Lymphs Abs 0.7 (L) 1.0 - 3.6 K/uL   Monocytes Relative 20 %   Monocytes Absolute 1.5 (H) 0.2 - 0.9 K/uL   Eosinophils Relative 0 %   Eosinophils Absolute 0.0 0 - 0.7 K/uL   Basophils Relative 1 %   Basophils Absolute 0.1 0 - 0.1 K/uL  Comprehensive metabolic panel     Status: Abnormal   Collection Time: 11/30/14  1:31 PM  Result Value Ref Range   Sodium 129 (L) 135 - 145 mmol/L   Potassium 3.5 3.5 - 5.1 mmol/L   Chloride 95 (L) 101 - 111 mmol/L   CO2 27 22 - 32 mmol/L   Glucose, Bld 116 (H) 65 - 99 mg/dL   BUN 9 6 - 20 mg/dL   Creatinine, Ser 0.59 0.44 - 1.00 mg/dL   Calcium 7.7 (L) 8.9 - 10.3 mg/dL    Total Protein 6.0 (L) 6.5 - 8.1 g/dL   Albumin 2.1 (L) 3.5 - 5.0 g/dL   AST 63 (H) 15 - 41 U/L   ALT 24 14 - 54 U/L   Alkaline Phosphatase 93 38 - 126 U/L   Total Bilirubin 1.8 (H) 0.3 - 1.2 mg/dL   GFR calc non Af Amer >60 >60 mL/min   GFR  calc Af Amer >60 >60 mL/min    Comment: (NOTE) The eGFR has been calculated using the CKD EPI equation. This calculation has not been validated in all clinical situations. eGFR's persistently <60 mL/min signify possible Chronic Kidney Disease.    Anion gap 7 5 - 15  Protime-INR     Status: Abnormal   Collection Time: 11/30/14  1:31 PM  Result Value Ref Range   Prothrombin Time 18.2 (H) 11.4 - 15.0 seconds   INR 1.49   APTT     Status: None   Collection Time: 11/30/14  1:31 PM  Result Value Ref Range   aPTT 31 24 - 36 seconds  Type and screen     Status: None   Collection Time: 11/30/14  1:31 PM  Result Value Ref Range   ABO/RH(D) AB NEG    Antibody Screen NEG    Sample Expiration 12/03/2014   Lipase, blood     Status: None   Collection Time: 11/30/14  1:31 PM  Result Value Ref Range   Lipase 31 22 - 51 U/L  Ethanol     Status: None   Collection Time: 11/30/14  1:31 PM  Result Value Ref Range   Alcohol, Ethyl (B) <5 <5 mg/dL    Comment:        LOWEST DETECTABLE LIMIT FOR SERUM ALCOHOL IS 5 mg/dL FOR MEDICAL PURPOSES ONLY   Magnesium     Status: None   Collection Time: 11/30/14  1:31 PM  Result Value Ref Range   Magnesium 1.9 1.7 - 2.4 mg/dL  ABO/Rh     Status: None   Collection Time: 11/30/14  1:32 PM  Result Value Ref Range   ABO/RH(D) AB NEG   Ammonia     Status: Abnormal   Collection Time: 11/30/14  7:08 PM  Result Value Ref Range   Ammonia 64 (H) 9 - 35 umol/L  Culture, blood (routine x 2)     Status: None (Preliminary result)   Collection Time: 11/30/14  7:52 PM  Result Value Ref Range   Specimen Description BLOOD RIGHT ARM    Special Requests BOTTLES DRAWN AEROBIC AND ANAEROBIC 8CC    Culture NO GROWTH < 12 HOURS      Report Status PENDING   Culture, blood (routine x 2)     Status: None (Preliminary result)   Collection Time: 11/30/14  7:57 PM  Result Value Ref Range   Specimen Description BLOOD LEFT HAND    Special Requests BOTTLES DRAWN AEROBIC AND ANAEROBIC [place]    Culture NO GROWTH < 12 HOURS    Report Status PENDING   Wound culture     Status: None (Preliminary result)   Collection Time: 11/30/14 10:48 PM  Result Value Ref Range   Specimen Description ABDOMEN    Special Requests Normal    Gram Stain PENDING    Culture      LIGHT GROWTH STAPHYLOCOCCUS AUREUS SUSCEPTIBILITIES TO FOLLOW    Report Status PENDING   Urine Drug Screen, Qualitative ([place] only)     Status: Abnormal   Collection Time: 12/01/14  2:33 AM  Result Value Ref Range   Tricyclic, Ur Screen NONE DETECTED NONE DETECTED   Amphetamines, Ur Screen POSITIVE (A) NONE DETECTED   MDMA (Ecstasy)Ur Screen NONE DETECTED NONE DETECTED   Cocaine Metabolite,Ur [place] NONE DETECTED NONE DETECTED   Opiate, Ur Screen NONE DETECTED NONE DETECTED   Phencyclidine (PCP) Ur S NONE DETECTED NONE DETECTED   Cannabinoid 50 Ng, Ur [place]  NONE DETECTED NONE DETECTED   Barbiturates, Ur Screen NONE DETECTED NONE DETECTED   Benzodiazepine, Ur Scrn POSITIVE (A) NONE DETECTED   Methadone Scn, Ur POSITIVE (A) NONE DETECTED    Comment: (NOTE) 409  Tricyclics, urine               Cutoff 1000 ng/mL 200  Amphetamines, urine             Cutoff 1000 ng/mL 300  MDMA (Ecstasy), urine           Cutoff 500 ng/mL 400  Cocaine Metabolite, urine       Cutoff 300 ng/mL 500  Opiate, urine                   Cutoff 300 ng/mL 600  Phencyclidine (PCP), urine      Cutoff 25 ng/mL 700  Cannabinoid, urine              Cutoff 50 ng/mL 800  Barbiturates, urine             Cutoff 200 ng/mL 900  Benzodiazepine, urine           Cutoff 200 ng/mL 1000 Methadone, urine                Cutoff 300 ng/mL 1100 1200 The urine drug screen provides only a preliminary, unconfirmed 1300  analytical test result and should not be used for non-medical 1400 purposes. Clinical consideration and professional judgment should 1500 be applied to any positive drug screen result due to possible 1600 interfering substances. A more specific alternate chemical method 1700 must be used in order to obtain a confirmed analytical result.  1800 Gas chromato graphy / mass spectrometry (GC/MS) is the preferred 1900 confirmatory method.   Protime-INR     Status: Abnormal   Collection Time: 12/01/14  4:34 AM  Result Value Ref Range   Prothrombin Time 20.0 (H) 11.4 - 15.0 seconds   INR 1.68   Comprehensive metabolic panel     Status: Abnormal   Collection Time: 12/01/14  4:34 AM  Result Value Ref Range   Sodium 132 (L) 135 - 145 mmol/L   Potassium 3.7 3.5 - 5.1 mmol/L   Chloride 101 101 - 111 mmol/L   CO2 27 22 - 32 mmol/L   Glucose, Bld 64 (L) 65 - 99 mg/dL   BUN 8 6 - 20 mg/dL   Creatinine, Ser 0.56 0.44 - 1.00 mg/dL   Calcium 7.6 (L) 8.9 - 10.3 mg/dL   Total Protein 5.2 (L) 6.5 - 8.1 g/dL   Albumin 1.7 (L) 3.5 - 5.0 g/dL   AST 43 (H) 15 - 41 U/L   ALT 18 14 - 54 U/L   Alkaline Phosphatase 75 38 - 126 U/L   Total Bilirubin 2.1 (H) 0.3 - 1.2 mg/dL   GFR calc non Af Amer >60 >60 mL/min   GFR calc Af Amer >60 >60 mL/min    Comment: (NOTE) The eGFR has been calculated using the CKD EPI equation. This calculation has not been validated in all clinical situations. eGFR's persistently <60 mL/min signify possible Chronic Kidney Disease.    Anion gap 4 (L) 5 - 15  CBC     Status: Abnormal   Collection Time: 12/01/14  4:34 AM  Result Value Ref Range   WBC 5.4 3.6 - 11.0 K/uL   RBC 2.96 (L) 3.80 - 5.20 MIL/uL   Hemoglobin 7.3 (L) 12.0 - 16.0 g/dL   HCT 23.6 (  L) 35.0 - 47.0 %   MCV 79.6 (L) 80.0 - 100.0 fL   MCH 24.7 (L) 26.0 - 34.0 pg   MCHC 31.0 (L) 32.0 - 36.0 g/dL   RDW 21.9 (H) 11.5 - 14.5 %   Platelets 115 (L) 150 - 440 K/uL    Vitals: Blood pressure 116/70, pulse 81,  temperature 98.2 F (36.8 C), temperature source Oral, resp. rate 16, height 5' 3"  (1.6 m), weight 61.236 kg (135 lb), SpO2 96 %.  Risk to Self: Is patient at risk for suicide?: No Risk to Others:   Prior Inpatient Therapy:   Prior Outpatient Therapy:    Current Facility-Administered Medications  Medication Dose Route Frequency Provider Last Rate Last Dose  . ALPRAZolam Duanne Moron) tablet 0.5 mg  0.5 mg Oral BID PRN Loletha Grayer, MD      . amphetamine-dextroamphetamine (ADDERALL) tablet 30 mg  30 mg Oral Q breakfast Theodoro Grist, MD   30 mg at 12/01/14 0900  . antiseptic oral rinse (CPC / CETYLPYRIDINIUM CHLORIDE 0.05%) solution 7 mL  7 mL Mouth Rinse q12n4p Theodoro Grist, MD   7 mL at 12/01/14 1200  . bacitracin ointment   Topical BID Theodoro Grist, MD   1 application at 05/39/76 0900  . bacitracin ointment   Topical BID Loletha Grayer, MD      . chlorhexidine (PERIDEX) 0.12 % solution 15 mL  15 mL Mouth Rinse BID Theodoro Grist, MD   15 mL at 12/01/14 1044  . feeding supplement (ENSURE ENLIVE) (ENSURE ENLIVE) liquid 237 mL  237 mL Oral BID BM Loletha Grayer, MD   237 mL at 12/01/14 1500  . methadone (DOLOPHINE) tablet 15 mg  15 mg Oral BID Loletha Grayer, MD   15 mg at 12/01/14 1729  . nadolol (CORGARD) tablet 10 mg  10 mg Oral Daily Theodoro Grist, MD   10 mg at 12/01/14 1044  . ondansetron (ZOFRAN) tablet 4 mg  4 mg Oral Q6H PRN Theodoro Grist, MD       Or  . ondansetron (ZOFRAN) injection 4 mg  4 mg Intravenous Q6H PRN Theodoro Grist, MD      . pantoprazole (PROTONIX) EC tablet 40 mg  40 mg Oral Daily Theodoro Grist, MD   40 mg at 12/01/14 1043  . piperacillin-tazobactam (ZOSYN) IVPB 3.375 g  3.375 g Intravenous 3 times per day Theodoro Grist, MD   3.375 g at 12/01/14 1452  . vancomycin (VANCOCIN) 500 mg in sodium chloride 0.9 % 100 mL IVPB  500 mg Intravenous Q12H Theodoro Grist, MD   500 mg at 12/01/14 1729    Musculoskeletal: Strength & Muscle Tone: decreased Gait & Station:  unsteady Patient leans: N/A  Psychiatric Specialty Exam: Physical Exam  Nursing note and vitals reviewed. Constitutional: She appears listless. She appears cachectic. She has a sickly appearance.  HENT:  Head: Normocephalic and atraumatic.  Eyes: Conjunctivae are normal. Pupils are equal, round, and reactive to light.  Neck: Normal range of motion.  Cardiovascular: Normal heart sounds.   Respiratory: Effort normal.  GI: Soft.    Musculoskeletal: Normal range of motion.  Neurological: She appears listless.  Skin: Skin is warm and dry.  Psychiatric: Thought content normal. Her affect is blunt. Her speech is delayed. She is slowed. Cognition and memory are impaired. She expresses impulsivity. She exhibits abnormal recent memory.  Patient is a sickly appearing woman. She was cooperative with the interview. Eye contact good. She is able to express a basic understanding of her  medical care and situation and does not appear to be psychotic although she does seem to be showing a tendency towards being impulsive. No evidence of suicidality.    Review of Systems  Constitutional: Positive for weight loss and malaise/fatigue.  HENT: Negative.   Eyes: Negative.   Respiratory: Negative.   Cardiovascular: Negative.   Gastrointestinal: Negative.   Musculoskeletal: Negative.   Skin: Negative.   Neurological: Negative.   Psychiatric/Behavioral: Positive for memory loss and substance abuse. Negative for depression, suicidal ideas and hallucinations. The patient is nervous/anxious. The patient does not have insomnia.     Blood pressure 116/70, pulse 81, temperature 98.2 F (36.8 C), temperature source Oral, resp. rate 16, height 5' 3"  (1.6 m), weight 61.236 kg (135 lb), SpO2 96 %.Body mass index is 23.92 kg/(m^2).  General Appearance: Disheveled  Eye Sport and exercise psychologist::  Fair  Speech:  Slow  Volume:  Decreased  Mood:  Euthymic  Affect:  Constricted  Thought Process:  Tangential  Orientation:  Full  (Time, Place, and Person)  Thought Content:  Negative  Suicidal Thoughts:  No  Homicidal Thoughts:  No  Memory:  Immediate;   Fair Recent;   Poor Remote;   Fair  Judgement:  Impaired  Insight:  Fair  Psychomotor Activity:  Decreased  Concentration:  Fair  Recall:  Poor  Fund of Knowledge:Poor  Language: Fair  Akathisia:  No  Handed:  Right  AIMS (if indicated):     Assets:  Chief Executive Officer Social Support  ADL's:  Impaired  Cognition: Impaired,  Mild  Sleep:      Medical Decision Making: Review of Psycho-Social Stressors (1), Review or order clinical lab tests (1), Established Problem, Worsening (2) and Review of Medication Regimen & Side Effects (2)  Treatment Plan Summary: Plan Patient denies any history of seizures or delirium tremens and does not appear to be having severe withdrawal at this point. She is denying suicidal or homicidal ideation. She is able to express an understanding of her medical condition. I think she meets the standard of having the capacity to make her own decisions. I also however think that she has at least a mild degree of dementia and clearly is not exercising the best judgment. I spent some time trying to make that clear to her that her medical problems were potentially fatal if not treated and that I strongly encouraged her to listen to what the internal medicine doctors are recommending before making any rash decisions. No indication for any psychiatric medicine. Discontinue the IVC. We'll follow-up as needed. Patient is not a good candidate for any referral to inpatient substance abuse treatment at this point.  Plan:  Patient does not meet criteria for psychiatric inpatient admission. Supportive therapy provided about ongoing stressors. as noted above as noted above Disposition:    Alethia Berthold 12/01/2014 6:39 PM

## 2014-12-01 NOTE — Progress Notes (Addendum)
Initial Nutrition Assessment  DOCUMENTATION CODES:   Severe malnutrition in context of chronic illness  INTERVENTION:   Meals and Snacks: Cater to patient preferences Medical Food Supplement Therapy: Recommend Ensure Enlive po BID, each supplement provides 350 kcal and 20 grams of protein   NUTRITION DIAGNOSIS:   Inadequate oral intake related to poor appetite, acute illness as evidenced by meal completion < 25%, per patient/family report.  GOAL:   Patient will meet greater than or equal to 90% of their needs  MONITOR:    (Energy Intake, Anthropometrics, Electrolyte and rneal Profile, Anthropometrics, Digestive system)  REASON FOR ASSESSMENT:   Consult Assessment of nutrition requirement/status (Malnutrition)  ASSESSMENT:   Pt involuntarily committed with severe protruding umbilical hernia, per surgery high risk for surgical intervention and not recommended. Pt with h/o EtOH use and hepatitis C.  Past Medical History  Diagnosis Date  . Panic attack   . Hepatitis C 1995  . Bipolar affective   . Chronic pain     legs  . Heart murmur   . Hernia, umbilical     Protruding    Diet Order:  Diet 2 gram sodium Room service appropriate?: Yes; Fluid consistency:: Thin    Current Nutrition: Pt ate 0% of breakfast this am. Per pt a friend is bringing a biscuit from community this am.   Food/Nutrition-Related History: Pt reports eating 1-2 meals per day at best PTA. Pt with long standing h/o EtOH use per MD note.    Medications: Protonix  Electrolyte/Renal Profile and Glucose Profile:   Recent Labs Lab 11/30/14 1331 12/01/14 0434  NA 129* 132*  K 3.5 3.7  CL 95* 101  CO2 27 27  BUN 9 8  CREATININE 0.59 0.56  CALCIUM 7.7* 7.6*  MG 1.9  --   GLUCOSE 116* 64*   Protein Profile:  Recent Labs Lab 11/30/14 1331 12/01/14 0434  ALBUMIN 2.1* 1.7*    Hepatic Function Latest Ref Rng 12/01/2014 11/30/2014 03/30/2014  Total Protein 6.5 - 8.1 g/dL 5.2(L) 6.0(L) 7.2   Albumin 3.5 - 5.0 g/dL 1.7(L) 2.1(L) 2.5(L)  AST 15 - 41 U/L 43(H) 63(H) 100(H)  ALT 14 - 54 U/L 18 24 47  Alk Phosphatase 38 - 126 U/L 75 93 195(H)  Total Bilirubin 0.3 - 1.2 mg/dL 2.1(H) 1.8(H) 1.0   Nutritional Anemia Profile:  CBC Latest Ref Rng 12/01/2014 11/30/2014 03/30/2014  WBC 3.6 - 11.0 K/uL 5.4 7.9 4.3  Hemoglobin 12.0 - 16.0 g/dL 7.3(L) 8.1(L) 8.2(L)  Hematocrit 35.0 - 47.0 % 23.6(L) 26.1(L) 27.3(L)  Platelets 150 - 440 K/uL 115(L) 150 100(L)   Lipase     Component Value Date/Time   LIPASE 31 11/30/2014 1331    Gastrointestinal Profile: Last BM: 11/30/2014   Nutrition-Focused Physical Exam Findings: Nutrition-Focused physical exam completed. Findings are mild/moderate fat depletion, moderate-severe muscle depletion, and moderate BLLE edema.     Weight Change: Pt reports UBW of 129lbs. Current weight in CHL of 135lbs. RD also notes edema present.   Height:   Ht Readings from Last 1 Encounters:  11/30/14 5' 3"  (1.6 m)    Weight:   Wt Readings from Last 1 Encounters:  11/30/14 135 lb (61.236 kg)    Wt Readings from Last 10 Encounters:  11/30/14 135 lb (61.236 kg)  11/27/12 127 lb (57.607 kg)    BMI:  Body mass index is 23.92 kg/(m^2).  Estimated Nutritional Needs:   Kcal:  1660-1962kcals, BEE: 1161kcals, TEE: (IF 1.1-1.3)(AF 1.3)  Protein:  67-80g protein (  1.1-1.3g/kg)   Fluid:  1530-1845m of fluid (25-355mkg)  EDUCATION NEEDS:   No education needs identified at this time   HIDanversRD, LDN Pager [phone]

## 2014-12-01 NOTE — Progress Notes (Signed)
Speech Therapy Note: received order, reviewed chart notes, consulted both pt and family member then NSG. All denied any trouble swallowing; no s/s of dysphagia noted during meals and meds. Pt stated she felt "better now" and stated "I don't have any trouble swallowing". Briefly discussed general aspiration precautions when eating meals; pt agreed. No further skilled ST services indicated at this time. ST will be available for anything further if indicated. Pt agreed. NSG to reconsult if nec.

## 2014-12-02 DIAGNOSIS — E43 Unspecified severe protein-calorie malnutrition: Secondary | ICD-10-CM | POA: Insufficient documentation

## 2014-12-02 MED ORDER — CLINDAMYCIN HCL 300 MG PO CAPS: 300.0000 mg | ORAL_CAPSULE | Freq: Three times a day (TID) | ORAL | Status: DC

## 2014-12-02 NOTE — Progress Notes (Signed)
IVC D/C'd, Pt signed AMA form. Teaching performed on importance of taking antibiotic at home. Adelina Mings RN

## 2014-12-02 NOTE — Care Management Note (Signed)
Case Management Note  Patient Details  Name: Stephanie Beck MRN: [id number] Date of Birth: 1955-11-17  Subjective/Objective:   Patient signing out AMA. Discharging on clindamycin. Good RX coupon given. Applications for [facility].  CM stressed the importance of compliance with the antibiotic.Due to self pay and AMA status patient does not qualify for home health services.              Action/Plan: Signing out AMA  Expected Discharge Date:                  Expected Discharge Plan:  Against Medical Advice  In-House Referral:     Discharge planning Services  CM Consult  Post Acute Care Choice:    Choice offered to:     DME Arranged:    DME Agency:     HH Arranged:    HH Agency:     Status of Service:  Completed, signed off  Medicare Important Message Given:    Date Medicare IM Given:    Medicare IM give by:    Date Additional Medicare IM Given:    Additional Medicare Important Message give by:     If discussed at Long Length of Stay Meetings, dates discussed:    Additional Comments:  Marily Memos, RN 12/02/2014, 10:36 AM

## 2014-12-02 NOTE — Discharge Summary (Signed)
[facility] - [place] at [facility]   PATIENT NAME: Stephanie Beck    MR#:  [id number]  DATE OF BIRTH:  01-19-1956  DATE OF ADMISSION:  11/30/2014 ADMITTING PHYSICIAN: Katharina Caper, MD  DATE OF signing out AGAINST MEDICAL ADVICE: 12/02/2014 11:00 AM  PRIMARY CARE PHYSICIAN: TATE,DENNY C, MD    ADMISSION DIAGNOSIS:  Cellulitis of abdominal wall [L03.311] Alcohol abuse [F10.10] Ascites [R18.8]  DISCHARGE DIAGNOSIS:  Principal Problem:   Alcohol abuse Active Problems:   Umbilical hernia   Liver cirrhosis   Ascites   Coagulopathy   Hyponatremia   Jaundice   Anemia   Cellulitis   Protein-calorie malnutrition, severe   SECONDARY DIAGNOSIS:   Past Medical History  Diagnosis Date  . Panic attack   . Hepatitis C 1995  . Bipolar affective   . Chronic pain     legs  . Heart murmur   . Hernia, umbilical     Protruding    HOSPITAL COURSE:   1. Abdominal wall cellulitis with incarcerated omentum through an umbilical hernia. Patient seen by surgical team and is a poor surgical candidate and they did not want to do surgery at this facility. I advised that the patient needed further IV antibiotics for her abdominal wall cellulitis. The patient had a different opinion and needed to get home. She signed out AGAINST MEDICAL ADVICE. I did give her prescription of clindamycin to cover infection. I advised that she needs to get to a tertiary care center such as [facility] or [name] to handle the incarcerated omentum through her umbilical hernia. Because if this turns gangrenous she will die.  2. End-stage liver disease with cirrhosis, history of alcohol abuse, history of hepatitis C, elevated liver function test, coagulopathy, thrombocytopenia. Overall prognosis poor. 3. Hyponatremia- improved with IV fluid hydration 4. Anemia of chronic disease 5. Chronic pain and anxiety  DISCHARGE CONDITIONS:   Guarded  CONSULTS OBTAINED:  Treatment Team:   Lattie Haw, MD Audery Amel, MD  DRUG ALLERGIES:   Allergies  Allergen Reactions  . Latex Itching    DISCHARGE MEDICATIONS:   Discharge Medication List as of 12/02/2014 11:01 AM    START taking these medications   Details  clindamycin (CLEOCIN) 300 MG capsule Take 1 capsule (300 mg total) by mouth 3 (three) times daily., Starting 12/02/2014, Until Discontinued, Print      CONTINUE these medications which have NOT CHANGED   Details  ALPRAZolam (XANAX) 1 MG tablet Take 1 mg by mouth 2 (two) times daily., Until Discontinued, Historical Med    amphetamine-dextroamphetamine (ADDERALL) 30 MG tablet Take 30 mg by mouth daily., Until Discontinued, Historical Med    methadone (DOLOPHINE) 10 MG tablet Take 20 mg by mouth 3 (three) times daily. , Until Discontinued, Historical Med    triamcinolone cream (KENALOG) 0.1 % Apply 1 application topically 2 (two) times daily., Until Discontinued, Historical Med         DISCHARGE INSTRUCTIONS:   Recommend following up at a tertiary care center that can fix the incarcerated hernia with omentum through it.  If you experience worsening of your admission symptoms, develop shortness of breath, life threatening emergency, suicidal or homicidal thoughts you must seek medical attention immediately by calling 911 or calling your MD immediately  if symptoms less severe.  You Must read complete instructions/literature along with all the possible adverse reactions/side effects for all the Medicines you take and that have been prescribed to you. Take any new Medicines  after you have completely understood and accept all the possible adverse reactions/side effects.   Please note  You were cared for by a hospitalist during your hospital stay. If you have any questions about your discharge medications or the care you received while you were in the hospital after you are discharged, you can call the unit and asked to speak with the hospitalist on call if  the hospitalist that took care of you is not available. Once you are discharged, your primary care physician will handle any further medical issues. Please note that NO REFILLS for any discharge medications will be authorized once you are discharged, as it is imperative that you return to your primary care physician (or establish a relationship with a primary care physician if you do not have one) for your aftercare needs so that they can reassess your need for medications and monitor your lab values.    Today   CHIEF COMPLAINT:   Chief Complaint  Patient presents with  . Cirrhosis    HISTORY OF PRESENT ILLNESS:  Stephanie Beck  is a 59 y.o. female with a known history of cirrhosis presented with abdominal wall redness and an incarcerated omentum through an umbilical hernia.   VITAL SIGNS:  Blood pressure 114/57, pulse 89, temperature 98.3 F (36.8 C), temperature source Oral, resp. rate 17, height  (1.6 m), weight 61.236 kg (135 lb), SpO2 99 %.  DATA REVIEW:   CBC  Recent Labs Lab 12/01/14 0434  WBC 5.4  HGB 7.3*  HCT 23.6*  PLT 115*    Chemistries   Recent Labs Lab 11/30/14 1331 12/01/14 0434  NA 129* 132*  K 3.5 3.7  CL 95* 101  CO2 27 27  GLUCOSE 116* 64*  BUN 9 8  CREATININE 0.59 0.56  CALCIUM 7.7* 7.6*  MG 1.9  --   AST 63* 43*  ALT 24 18  ALKPHOS 93 75  BILITOT 1.8* 2.1*     Microbiology Results  Results for orders placed or performed during the hospital encounter of 11/30/14  Culture, blood (routine x 2)     Status: None (Preliminary result)   Collection Time: 11/30/14  7:52 PM  Result Value Ref Range Status   Specimen Description BLOOD RIGHT ARM  Final   Special Requests BOTTLES DRAWN AEROBIC AND ANAEROBIC 8CC  Final   Culture NO GROWTH 2 DAYS  Final   Report Status PENDING  Incomplete  Culture, blood (routine x 2)     Status: None (Preliminary result)   Collection Time: 11/30/14  7:57 PM  Result Value Ref Range Status   Specimen  Description BLOOD LEFT HAND  Final   Special Requests BOTTLES DRAWN AEROBIC AND ANAEROBIC 7CC  Final   Culture NO GROWTH 2 DAYS  Final   Report Status PENDING  Incomplete  Wound culture     Status: None (Preliminary result)   Collection Time: 11/30/14 10:48 PM  Result Value Ref Range Status   Specimen Description ABDOMEN  Final   Special Requests Normal  Final   Gram Stain   Final    FEW WBC SEEN RARE GRAM NEGATIVE RODS FEW GRAM POSITIVE COCCI    Culture   Final    LIGHT GROWTH STAPHYLOCOCCUS AUREUS RARE GRAM NEGATIVE RODS IDENTIFICATION AND SUSCEPTIBILITIES TO FOLLOW    Report Status PENDING  Incomplete   Organism ID, Bacteria STAPHYLOCOCCUS AUREUS  Final      Susceptibility   Staphylococcus aureus - MIC*    CIPROFLOXACIN <=0.5 SENSITIVE  Sensitive     GENTAMICIN <=0.5 SENSITIVE Sensitive     OXACILLIN <=0.25 SENSITIVE Sensitive     TRIMETH/SULFA <=10 SENSITIVE Sensitive     CEFOXITIN SCREEN NEGATIVE Sensitive     Inducible Clindamycin NEGATIVE Sensitive     * LIGHT GROWTH STAPHYLOCOCCUS AUREUS    RADIOLOGY:  US Abdomen Limited  11/30/2014   CLINICAL DATA:  Abdominal distention.  Ascites.  EXAM: LIMITED ABDOMEN ULTRASOUND FOR ASCITES  TECHNIQUE: Limited ultrasound survey for ascites was performed in all four abdominal quadrants.  COMPARISON:  CT abdomen 10/14/2012  FINDINGS: Limited ultrasound imaging of the 4 quadrants of the abdomen was obtained. Moderate free fluid is demonstrated in all 4 quadrants consistent with diffuse ascites. The visualized liver demonstrates a nodular contour suggesting cirrhosis.  IMPRESSION: Positive examination for moderate abdominal ascites in 4 quadrants.   Electronically Signed   By: Burman Nieves M.D.   On: 11/30/2014 18:27    Management plans discussed with the patient, family and they are in agreement.  CODE STATUS:     Code Status Orders        Start     Ordered   11/30/14 1938  Full code   Continuous     11/30/14 1937     Advance Directive Documentation        Most Recent Value   Type of Advance Directive  Healthcare Power of Attorney   Pre-existing out of facility DNR order (yellow form or pink MOST form)     "MOST" Form in Place?        TOTAL TIME TAKING CARE OF THIS PATIENT: 30 minutes.    Alford Highland M.D on 12/02/2014 at 1:11 PM, I like to make sure the patient actually physically leaves before do the discharge summary when the sign out AGAINST MEDICAL ADVICE.  Between 7am to 6pm - Pager - [phone]  After 6pm go to [email or web address] - password [email or web address] [facility]  [place] [facility]  Office  [phone]  CC: Primary care physician; Jaclyn Shaggy, MD

## 2014-12-02 NOTE — Progress Notes (Signed)
Patient ID: Stephanie Beck, female   DOB: 09-Apr-1956, 59 y.o.   MRN: [id number] [facility] PROGRESS NOTE  PCP: Jaclyn Shaggy, MD  HPI/Subjective: The patient states that she's going home today. She has things to do at home. She feels well. The incarcerated hernia with omentum has been coming out for about a week now.  Objective: Filed Vitals:   12/02/14 0812  BP: 114/57  Pulse: 89  Temp: 98.3 F (36.8 C)  Resp:     Filed Weights   11/30/14 1324 11/30/14 1936  Weight: 60.192 kg (132 lb 11.2 oz) 61.236 kg (135 lb)    ROS: Review of Systems  Constitutional: Negative for fever and chills.  Eyes: Negative for blurred vision.  Respiratory: Negative for cough and shortness of breath.   Cardiovascular: Negative for chest pain.  Gastrointestinal: Negative for nausea, vomiting, abdominal pain, diarrhea and constipation.  Genitourinary: Negative for dysuria.  Musculoskeletal: Positive for myalgias. Negative for joint pain.  Neurological: Negative for dizziness and headaches.   Exam: Physical Exam  Constitutional: She is oriented to person, place, and time.  HENT:  Nose: No mucosal edema.  Mouth/Throat: No oropharyngeal exudate or posterior oropharyngeal edema.  Eyes: Conjunctivae, EOM and lids are normal. Pupils are equal, round, and reactive to light.  Neck: No JVD present. Carotid bruit is not present. No edema present. No thyroid mass and no thyromegaly present.  Cardiovascular: S1 normal and S2 normal.  Exam reveals no gallop.   No murmur heard. Pulses:      Dorsalis pedis pulses are 2+ on the right side, and 2+ on the left side.  Respiratory: No respiratory distress. She has no wheezes. She has no rhonchi. She has no rales.  GI: Soft. Bowel sounds are normal. She exhibits distension. There is no tenderness.  Umbilical hernia with protruding omentum coming out at least 5 inches from the opening of the umbilical hernia.  Musculoskeletal:       Right ankle:  She exhibits no swelling.       Left ankle: She exhibits no swelling.  Lymphadenopathy:    She has no cervical adenopathy.  Neurological: She is alert and oriented to person, place, and time. No cranial nerve deficit.  Skin: Skin is warm. No rash noted. Nails show no clubbing.  Erythema still present around the abdomen with warmth.  Psychiatric: She has a normal mood and affect.    Data Reviewed: Basic Metabolic Panel:  Recent Labs Lab 11/30/14 1331 12/01/14 0434  NA 129* 132*  K 3.5 3.7  CL 95* 101  CO2 27 27  GLUCOSE 116* 64*  BUN 9 8  CREATININE 0.59 0.56  CALCIUM 7.7* 7.6*  MG 1.9  --    Liver Function Tests:  Recent Labs Lab 11/30/14 1331 12/01/14 0434  AST 63* 43*  ALT 24 18  ALKPHOS 93 75  BILITOT 1.8* 2.1*  PROT 6.0* 5.2*  ALBUMIN 2.1* 1.7*    Recent Labs Lab 11/30/14 1331  LIPASE 31    Recent Labs Lab 11/30/14 1908  AMMONIA 64*   CBC:  Recent Labs Lab 11/30/14 1331 12/01/14 0434  WBC 7.9 5.4  NEUTROABS 5.6  --   HGB 8.1* 7.3*  HCT 26.1* 23.6*  MCV 79.6* 79.6*  PLT 150 115*     Recent Results (from the past 240 hour(s))  Culture, blood (routine x 2)     Status: None (Preliminary result)   Collection Time: 11/30/14  7:52 PM  Result Value Ref Range  Status   Specimen Description BLOOD RIGHT ARM  Final   Special Requests BOTTLES DRAWN AEROBIC AND ANAEROBIC 8CC  Final   Culture NO GROWTH 2 DAYS  Final   Report Status PENDING  Incomplete  Culture, blood (routine x 2)     Status: None (Preliminary result)   Collection Time: 11/30/14  7:57 PM  Result Value Ref Range Status   Specimen Description BLOOD LEFT HAND  Final   Special Requests BOTTLES DRAWN AEROBIC AND ANAEROBIC 7CC  Final   Culture NO GROWTH 2 DAYS  Final   Report Status PENDING  Incomplete  Wound culture     Status: None (Preliminary result)   Collection Time: 11/30/14 10:48 PM  Result Value Ref Range Status   Specimen Description ABDOMEN  Final   Special Requests  Normal  Final   Gram Stain PENDING  Incomplete   Culture   Final    LIGHT GROWTH STAPHYLOCOCCUS AUREUS SUSCEPTIBILITIES TO FOLLOW    Report Status PENDING  Incomplete     Studies: US Abdomen Limited  11/30/2014   CLINICAL DATA:  Abdominal distention.  Ascites.  EXAM: LIMITED ABDOMEN ULTRASOUND FOR ASCITES  TECHNIQUE: Limited ultrasound survey for ascites was performed in all four abdominal quadrants.  COMPARISON:  CT abdomen 10/14/2012  FINDINGS: Limited ultrasound imaging of the 4 quadrants of the abdomen was obtained. Moderate free fluid is demonstrated in all 4 quadrants consistent with diffuse ascites. The visualized liver demonstrates a nodular contour suggesting cirrhosis.  IMPRESSION: Positive examination for moderate abdominal ascites in 4 quadrants.   Electronically Signed   By: Burman Nieves M.D.   On: 11/30/2014 18:27    Scheduled Meds: . amphetamine-dextroamphetamine  30 mg Oral Q breakfast  . antiseptic oral rinse  7 mL Mouth Rinse q12n4p  . bacitracin   Topical BID  . bacitracin   Topical BID  . chlorhexidine  15 mL Mouth Rinse BID  . feeding supplement (ENSURE ENLIVE)  237 mL Oral BID BM  . methadone  15 mg Oral BID  . nadolol  10 mg Oral Daily  . pantoprazole  40 mg Oral Daily  . piperacillin-tazobactam (ZOSYN)  IV  3.375 g Intravenous 3 times per day  . vancomycin  500 mg Intravenous Q12H    Assessment/Plan:  1. Abdominal wall cellulitis, incarcerated umbilical hernia with protruding omentum 5 inches out from the perforated abdominal hernia. Surgery does not want to operate. In my medical opinion the patient still needs IV antibiotics. The patient will sign out AGAINST MEDICAL ADVICE. I will give by mouth clindamycin prescription. I told the patient that she will likely die if she does not get this incarcerated umbilical hernia taking care of. Since the surgeons here will not operate on her she needs to be followed up at a tertiary care center. 2. Cirrhosis,  hepatitis C, coagulopathy, malnutrition, elevated liver function tests, thrombocytopenia- overall prognosis poor. 3. Anemia of chronic disease 4. Hyponatremia likely also secondary to liver disease. 5. Chronic pain - on methadone 6. Involuntary commitment removed by psychiatry. Patient can sign out AGAINST MEDICAL ADVICE.  Code Status:     Code Status Orders        Start     Ordered   11/30/14 1938  Full code   Continuous     11/30/14 1937    Advance Directive Documentation        Most Recent Value   Type of Advance Directive  Healthcare Power of Attorney   Pre-existing out of  facility DNR order (yellow form or pink MOST form)     "MOST" Form in Place?       Disposition Plan: Patient will likely sign out AGAINST MEDICAL ADVICE.   Consultants:  Surgery  Psychiatry  Time spent: 30 minutes  Alford Highland  [facility]

## 2014-12-02 NOTE — Consult Note (Signed)
  Psychiatry: Follow up. Spoke with nursing. Patient is requesting to leave AMA. IVC was discontinued yesterday and today I have mode sure the IVC order in the computer was cancelled. As of evaluation yesterday patient did not meet criteria for IVC and expressed understanding of her illnesses and the potentially fatal consequences of them.

## 2014-12-04 LAB — WOUND CULTURE: Special Requests: NORMAL

## 2014-12-05 LAB — CULTURE, BLOOD (ROUTINE X 2)
Culture: NO GROWTH
Culture: NO GROWTH

## 2015-01-10 ENCOUNTER — Inpatient Hospital Stay
Admission: EM | Admit: 2015-01-10 | Discharge: 2015-01-13 | DRG: 377 | Disposition: A | Discharge status: Home Health | Payer: Medicare Other | Attending: Internal Medicine | Admitting: Internal Medicine

## 2015-01-10 DIAGNOSIS — R1900 Intra-abdominal and pelvic swelling, mass and lump, unspecified site: Secondary | ICD-10-CM | POA: Diagnosis present

## 2015-01-10 DIAGNOSIS — D696 Thrombocytopenia, unspecified: Secondary | ICD-10-CM | POA: Diagnosis present

## 2015-01-10 DIAGNOSIS — F1020 Alcohol dependence, uncomplicated: Secondary | ICD-10-CM | POA: Diagnosis present

## 2015-01-10 DIAGNOSIS — F419 Anxiety disorder, unspecified: Secondary | ICD-10-CM | POA: Diagnosis present

## 2015-01-10 DIAGNOSIS — K922 Gastrointestinal hemorrhage, unspecified: Secondary | ICD-10-CM | POA: Diagnosis present

## 2015-01-10 DIAGNOSIS — K429 Umbilical hernia without obstruction or gangrene: Secondary | ICD-10-CM | POA: Diagnosis present

## 2015-01-10 DIAGNOSIS — G9349 Other encephalopathy: Secondary | ICD-10-CM | POA: Diagnosis present

## 2015-01-10 DIAGNOSIS — K7460 Unspecified cirrhosis of liver: Secondary | ICD-10-CM | POA: Diagnosis present

## 2015-01-10 DIAGNOSIS — R64 Cachexia: Secondary | ICD-10-CM | POA: Diagnosis present

## 2015-01-10 DIAGNOSIS — Z823 Family history of stroke: Secondary | ICD-10-CM

## 2015-01-10 DIAGNOSIS — G9340 Encephalopathy, unspecified: Secondary | ICD-10-CM | POA: Diagnosis present

## 2015-01-10 DIAGNOSIS — K7031 Alcoholic cirrhosis of liver with ascites: Secondary | ICD-10-CM | POA: Diagnosis present

## 2015-01-10 DIAGNOSIS — N390 Urinary tract infection, site not specified: Secondary | ICD-10-CM | POA: Diagnosis present

## 2015-01-10 DIAGNOSIS — Z8249 Family history of ischemic heart disease and other diseases of the circulatory system: Secondary | ICD-10-CM

## 2015-01-10 DIAGNOSIS — K420 Umbilical hernia with obstruction, without gangrene: Secondary | ICD-10-CM | POA: Diagnosis present

## 2015-01-10 DIAGNOSIS — N179 Acute kidney failure, unspecified: Secondary | ICD-10-CM | POA: Diagnosis present

## 2015-01-10 DIAGNOSIS — D500 Iron deficiency anemia secondary to blood loss (chronic): Secondary | ICD-10-CM

## 2015-01-10 DIAGNOSIS — R410 Disorientation, unspecified: Secondary | ICD-10-CM | POA: Diagnosis present

## 2015-01-10 DIAGNOSIS — E46 Unspecified protein-calorie malnutrition: Secondary | ICD-10-CM | POA: Diagnosis present

## 2015-01-10 DIAGNOSIS — N950 Postmenopausal bleeding: Secondary | ICD-10-CM | POA: Diagnosis present

## 2015-01-10 DIAGNOSIS — N939 Abnormal uterine and vaginal bleeding, unspecified: Secondary | ICD-10-CM

## 2015-01-10 DIAGNOSIS — Z9119 Patient's noncompliance with other medical treatment and regimen: Secondary | ICD-10-CM

## 2015-01-10 DIAGNOSIS — F1010 Alcohol abuse, uncomplicated: Secondary | ICD-10-CM | POA: Diagnosis present

## 2015-01-10 DIAGNOSIS — D649 Anemia, unspecified: Secondary | ICD-10-CM | POA: Diagnosis present

## 2015-01-10 HISTORY — DX: Unspecified cirrhosis of liver: K74.60

## 2015-01-10 HISTORY — DX: Anxiety disorder, unspecified: F41.9

## 2015-01-10 LAB — CBC WITH DIFFERENTIAL/PLATELET
BAND NEUTROPHILS: 0 %
BASOS PCT: 1 %
Basophils Absolute: 0.1 10*3/uL (ref 0–0.1)
Blasts: 0 %
EOS ABS: 0.1 10*3/uL (ref 0–0.7)
EOS PCT: 1 %
HCT: 12.6 % — CL (ref 35.0–47.0)
HEMOGLOBIN: 3.8 g/dL — AB (ref 12.0–16.0)
LYMPHS ABS: 1.1 10*3/uL (ref 1.0–3.6)
LYMPHS PCT: 15 %
MCH: 24 pg — AB (ref 26.0–34.0)
MCHC: 30 g/dL — AB (ref 32.0–36.0)
MCV: 80.1 fL (ref 80.0–100.0)
MONO ABS: 0.3 10*3/uL (ref 0.2–0.9)
MONOS PCT: 4 %
Metamyelocytes Relative: 0 %
Myelocytes: 0 %
NEUTROS ABS: 5.7 10*3/uL (ref 1.4–6.5)
NEUTROS PCT: 79 %
NRBC: 0 /100{WBCs}
OTHER: 0 %
PROMYELOCYTES ABS: 0 %
Platelets: 114 10*3/uL — ABNORMAL LOW (ref 150–440)
RBC: 1.58 MIL/uL — ABNORMAL LOW (ref 3.80–5.20)
RDW: 22.4 % — AB (ref 11.5–14.5)
WBC: 7.3 10*3/uL (ref 3.6–11.0)

## 2015-01-10 LAB — COMPREHENSIVE METABOLIC PANEL
ALBUMIN: 1.6 g/dL — AB (ref 3.5–5.0)
ALK PHOS: 83 U/L (ref 38–126)
ALT: 24 U/L (ref 14–54)
ANION GAP: 8 (ref 5–15)
AST: 48 U/L — ABNORMAL HIGH (ref 15–41)
BUN: 52 mg/dL — ABNORMAL HIGH (ref 6–20)
CALCIUM: 7.5 mg/dL — AB (ref 8.9–10.3)
CO2: 19 mmol/L — AB (ref 22–32)
Chloride: 109 mmol/L (ref 101–111)
Creatinine, Ser: 1.59 mg/dL — ABNORMAL HIGH (ref 0.44–1.00)
GFR calc non Af Amer: 35 mL/min — ABNORMAL LOW (ref >60)
GFR, EST AFRICAN AMERICAN: 40 mL/min — AB (ref >60)
Glucose, Bld: 186 mg/dL — ABNORMAL HIGH (ref 65–99)
POTASSIUM: 3.7 mmol/L (ref 3.5–5.1)
SODIUM: 136 mmol/L (ref 135–145)
TOTAL PROTEIN: 5.8 g/dL — AB (ref 6.5–8.1)
Total Bilirubin: 2.2 mg/dL — ABNORMAL HIGH (ref 0.3–1.2)

## 2015-01-10 LAB — PROTIME-INR
INR: 1.89
PROTHROMBIN TIME: 21.9 s — AB (ref 11.4–15.0)

## 2015-01-10 LAB — TROPONIN I: TROPONIN I: 0.03 ng/mL (ref <0.031)

## 2015-01-10 LAB — AMMONIA: Ammonia: 37 umol/L — ABNORMAL HIGH (ref 9–35)

## 2015-01-10 LAB — PREPARE RBC (CROSSMATCH)

## 2015-01-10 MED ORDER — SODIUM CHLORIDE 0.9 % IV SOLN
1000.0000 mL | Freq: Once | INTRAVENOUS | Status: AC
  Administered 2015-01-10: 1000 mL via INTRAVENOUS

## 2015-01-10 NOTE — ED Provider Notes (Signed)
[facility] Emergency Department Provider Note     Time seen: ----------------------------------------- 8:49 PM on 01/10/2015 -----------------------------------------  L5 caveat: Review of systems and history is difficult to pain due to confusion.  I have reviewed the triage vital signs and the nursing notes.   HISTORY  Chief Complaint No chief complaint on file.    HPI Stephanie Beck is a 59 y.o. female brought to the ER for was originally called out as an umbilical hernia. Patient has a protrusion from her umbilicus of the family was concerned about. Of note she has not been complaining of any abdominal pain. She has a chronic history of alcoholism, is disoriented at baseline. Currently she is a full code.   Past Medical History  Diagnosis Date  . Panic attack   . Hepatitis C 1995  . Bipolar affective   . Chronic pain     legs  . Heart murmur   . Hernia, umbilical     Protruding    Patient Active Problem List   Diagnosis Date Noted  . Protein-calorie malnutrition, severe (HCC) 12/02/2014  . Umbilical hernia 11/30/2014  . Liver cirrhosis (HCC) 11/30/2014  . Ascites 11/30/2014  . Coagulopathy (HCC) 11/30/2014  . Hyponatremia 11/30/2014  . Jaundice 11/30/2014  . Anemia 11/30/2014  . Cellulitis 11/30/2014  . Alcohol abuse   . Cellulitis of abdominal wall   . Gallbladder polyp 11/27/2012    Past Surgical History  Procedure Laterality Date  . Breast surgery  2000  . Liver biopsy  2005    Allergies Latex  Social History Social History  Substance Use Topics  . Smoking status: Current Every Day Smoker -- 1.00 packs/day for 12 years  . Smokeless tobacco: Never Used  . Alcohol Use: Yes    Review of Systems Reported history of abdominal pain and abdominal protrusion  ____________________________________________   PHYSICAL EXAM:  VITAL SIGNS: ED Triage Vitals  Enc Vitals Group     BP --      Pulse --      Resp --     Temp --      Temp src --      SpO2 --      Weight --      Height --      Head Cir --      Peak Flow --      Pain Score --      Pain Loc --      Pain Edu? --      Excl. in GC? --     Constitutional: Alert but disoriented, cachectic and generally ill-appearing Eyes: Scleral icterus. PERRL. Normal extraocular movements. ENT   Head: Normocephalic and atraumatic.   Nose: No congestion/rhinnorhea.   Mouth/Throat: Mucous membranes are dry   Neck: No stridor. Cardiovascular: Normal rate, regular rhythm. Normal and symmetric distal pulses are present in all extremities. No murmurs, rubs, or gallops. Respiratory: Normal respiratory effort without tachypnea nor retractions. Breath sounds are clear and equal bilaterally. No wheezes/rales/rhonchi. Gastrointestinal: There is a markedly protruding umbilical hernia, abdomen is nontender but is distended. There is hepatomegaly present. Rectal: Trace heme positive stool Musculoskeletal: Nontender with normal range of motion in all extremities. No joint effusions.  No lower extremity tenderness nor edema. Neurologic:  Generalized weakness, nothing focal Skin: Patient has extensive jaundice Psychiatric: Mood and affect are normal. ____________________________________________  EKG: Interpreted by me. Normal sinus rhythm with a rate of 90 bpm, nonspecific ST and T wave changes, normal axis.  No evidence of hypertrophy or acute infarction. Long QT is evident.  ____________________________________________  ED COURSE:  Pertinent labs & imaging results that were available during my care of the patient were reviewed by me and considered in my medical decision making (see chart for details). Patient looks markedly chronically ill. Has a likely chronic umbilical hernia. She will however need labs and likely fluids. ____________________________________________    LABS (pertinent positives/negatives)  Labs Reviewed  CBC WITH  DIFFERENTIAL/PLATELET - Abnormal; Notable for the following:    RBC 1.58 (*)    Hemoglobin 3.8 (*)    HCT 12.6 (*)    MCH 24.0 (*)    MCHC 30.0 (*)    RDW 22.4 (*)    Platelets 114 (*)    All other components within normal limits  COMPREHENSIVE METABOLIC PANEL - Abnormal; Notable for the following:    CO2 19 (*)    Glucose, Bld 186 (*)    BUN 52 (*)    Creatinine, Ser 1.59 (*)    Calcium 7.5 (*)    Total Protein 5.8 (*)    Albumin 1.6 (*)    AST 48 (*)    Total Bilirubin 2.2 (*)    GFR calc non Af Amer 35 (*)    GFR calc Af Amer 40 (*)    All other components within normal limits  PROTIME-INR - Abnormal; Notable for the following:    Prothrombin Time 21.9 (*)    All other components within normal limits  TROPONIN I  AMMONIA  URINALYSIS COMPLETEWITH MICROSCOPIC ([facility] ONLY)  CBG MONITORING, ED  TYPE AND SCREEN  PREPARE RBC (CROSSMATCH)   CRITICAL CARE Performed by: Emily Filbert   Total critical care time: 30 minutes  Critical care time was exclusive of separately billable procedures and treating other patients.  Critical care was necessary to treat or prevent imminent or life-threatening deterioration.  Critical care was time spent personally by me on the following activities: development of treatment plan with patient and/or surrogate as well as nursing, discussions with consultants, evaluation of patient's response to treatment, examination of patient, obtaining history from patient or surrogate, ordering and performing treatments and interventions, ordering and review of laboratory studies, ordering and review of radiographic studies, pulse oximetry and re-evaluation of patient's condition.  ____________________________________________  FINAL ASSESSMENT AND PLAN  Chronic umbilical hernia, anemia, altered mental status  Plan: Patient with labs as dictated above. Patient marked anemia, she'll receive a 2 unit packed red blood cell IV infusion. Reason for  blood loss is usually GI secondary to her cirrhosis and chronic alcoholism. Altered mental status likely secondary to her anemia level which is severe. Patient remained seriously ill, will need to be admitted.   Emily Filbert, MD   Emily Filbert, MD 01/10/15 [phone]

## 2015-01-10 NOTE — H&P (Signed)
[facility] - [place] at [facility]   PATIENT NAME: Stephanie Beck    MR#:  [id number]  DATE OF BIRTH:  [birth year]  DATE OF ADMISSION:  01/10/2015  PRIMARY CARE PHYSICIAN: Jaclyn Shaggy, MD   REQUESTING/REFERRING PHYSICIAN: Mayford Knife, M.D.  CHIEF COMPLAINT:   Chief Complaint  Patient presents with  . Hernia    HISTORY OF PRESENT ILLNESS:  Stephanie Beck  is a 59 y.o. female who presents with acute encephalopathy. Patient comes in with a complaint of protruding and bleeding umbilical hernia with abdominal pain. However, her healthcare power of attorney who is present with her states that for the last 3 days she has been unable to complete her activities of daily living, and has been intermittently confused in a waxing and waning fashion. Patient denies any symptoms other than her umbilical hernia. She states that she feels fine and wants to go home. However on evaluation in the ED she was found to have a hemoglobin of 3.8. She was heme positive on rectal exam. Hospitalists were called for admission for profound anemia requiring transfusion, and question of GI bleeding versus ulcerated slow bleed from her umbilical hernia. Of note she has a history of heavy alcohol abuse, recent diagnosis of liver cirrhosis from alcohol and hep C. Her power of attorney also states that she was recently diagnosed with some abdominal mass, which is felt to be a driving factor behind her hernia. He states that she was seen here for the hernia in the recent past, was told that she would need to have it repaired similarly [facility] or [facility]. Patient is unable to confirm or deny melena, but denies any hematemesis.  PAST MEDICAL HISTORY:   Past Medical History  Diagnosis Date  . Panic attack   . Hepatitis C 1995  . Bipolar affective (HCC)   . Chronic pain     legs  . Heart murmur   . Hernia, umbilical     Protruding  . Anxiety   . Cirrhosis (HCC)     PAST SURGICAL HISTORY:   Past  Surgical History  Procedure Laterality Date  . Breast surgery  2000  . Liver biopsy  2005    SOCIAL HISTORY:   Social History  Substance Use Topics  . Smoking status: Current Every Day Smoker -- 1.00 packs/day for 12 years  . Smokeless tobacco: Never Used  . Alcohol Use: Yes    FAMILY HISTORY:   Family History  Problem Relation Age of Onset  . Heart attack Father   . Stroke Mother     DRUG ALLERGIES:   Allergies  Allergen Reactions  . Latex Itching    MEDICATIONS AT HOME:   Prior to Admission medications   Medication Sig Start Date End Date Taking? Authorizing Provider  ALPRAZolam Prudy Feeler) 1 MG tablet Take 1 mg by mouth 2 (two) times daily.   Yes Historical Provider, MD  amphetamine-dextroamphetamine (ADDERALL) 30 MG tablet Take 30 mg by mouth daily.   Yes Historical Provider, MD  methadone (DOLOPHINE) 10 MG tablet Take 20 mg by mouth 3 (three) times daily.    Yes Historical Provider, MD  spironolactone (ALDACTONE) 50 MG tablet Take 1 tablet by mouth 2 (two) times daily. 12/17/14  Yes Historical Provider, MD  triamcinolone cream (KENALOG) 0.1 % Apply 1 application topically 2 (two) times daily.   Yes Historical Provider, MD  clindamycin (CLEOCIN) 300 MG capsule Take 1 capsule (300 mg total) by mouth 3 (three) times daily. 12/02/14  Alford Highland, MD    REVIEW OF SYSTEMS:  Review of Systems  Constitutional: Negative for fever, chills, weight loss and malaise/fatigue.  HENT: Negative for ear pain, hearing loss and tinnitus.   Eyes: Negative for blurred vision, double vision, pain and redness.  Respiratory: Negative for cough, hemoptysis and shortness of breath.   Cardiovascular: Negative for chest pain, palpitations, orthopnea and leg swelling.  Gastrointestinal: Positive for abdominal pain. Negative for nausea, vomiting, diarrhea and constipation.       Protruding and bleeding ulcerated umbilical hernia  Genitourinary: Negative for dysuria, frequency and hematuria.   Musculoskeletal: Negative for back pain, joint pain and neck pain.  Skin:       No acne, rash, or lesions  Neurological: Negative for dizziness, tremors, focal weakness and weakness.  Endo/Heme/Allergies: Negative for polydipsia. Does not bruise/bleed easily.  Psychiatric/Behavioral: Negative for depression. The patient is not nervous/anxious and does not have insomnia.     Review of systems is limited reliability given the patient's encephalopathy. VITAL SIGNS:   Filed Vitals:   01/10/15 2101 01/10/15 2103 01/10/15 2308  BP:  117/64 139/68  Pulse:  78 93  Temp:  98 F (36.7 C) 97.9 F (36.6 C)  TempSrc:   Oral  Resp:  16 15  SpO2: 97% 99% 98%   Wt Readings from Last 3 Encounters:  11/30/14 61.236 kg (135 lb)  11/27/12 57.607 kg (127 lb)    PHYSICAL EXAMINATION:  Physical Exam  Vitals reviewed. Constitutional: She appears well-developed. No distress.  Cachectic  HENT:  Head: Normocephalic and atraumatic.  Eyes: Conjunctivae and EOM are normal. Pupils are equal, round, and reactive to light. No scleral icterus.  Neck: Normal range of motion. Neck supple. No JVD present. No thyromegaly present.  Cardiovascular: Regular rhythm and intact distal pulses.  Exam reveals no gallop and no friction rub.   No murmur heard. Tachycardic  Respiratory: Effort normal and breath sounds normal. No respiratory distress. She has no wheezes. She has no rales.  GI: Soft. Bowel sounds are normal. She exhibits no distension. There is tenderness (Around umbilicus).  Protruding umbilical hernia, protruding about 6-8 inches, with ulceration and skin breakdown along the entire circumference. Nonerythematous without purulence and does not appear otherwise infected.  Musculoskeletal: Normal range of motion. She exhibits no edema.  No arthritis, no gout  Lymphadenopathy:    She has no cervical adenopathy.  Neurological: She is alert. No cranial nerve deficit.  No dysarthria, no aphasia  Skin: Skin  is warm and dry. No rash noted. No erythema.  Psychiatric:  Unable to fully assess due to encephalopathy    LABORATORY PANEL:   CBC  Recent Labs Lab 01/10/15 2057  WBC 7.3  HGB 3.8*  HCT 12.6*  PLT 114*   ------------------------------------------------------------------------------------------------------------------  Chemistries   Recent Labs Lab 01/10/15 2057  NA 136  K 3.7  CL 109  CO2 19*  GLUCOSE 186*  BUN 52*  CREATININE 1.59*  CALCIUM 7.5*  AST 48*  ALT 24  ALKPHOS 83  BILITOT 2.2*   ------------------------------------------------------------------------------------------------------------------  Cardiac Enzymes  Recent Labs Lab 01/10/15 2057  TROPONINI 0.03   ------------------------------------------------------------------------------------------------------------------  RADIOLOGY:  No results found.  EKG:   Orders placed or performed during the hospital encounter of 01/10/15  . ED EKG  . ED EKG  . EKG 12-Lead  . EKG 12-Lead    IMPRESSION AND PLAN:  Principal Problem:   GI bleed - unclear at this time if her profound anemia is from slow  GI bleed, or from slow recurrent bleeding from her ulcerated umbilical hernia. However, she was heme positive on rectal exam. Due to her encephalopathy she is unable to confirm or deny any overt melena. She does deny any hematemesis. Started Protonix drip, and order GI consult. Nothing by mouth tonight. Active Problems:   Anemia - hemoglobin down to 3.8. Was 7.3 during hospital stay about 6 weeks ago. 2 units of blood ordered to be transfused in the ED. We'll recheck hemoglobin after that and order further transfusion as needed   Acute encephalopathy - likely due to her profound anemia and overall malnutrition. Ammonia level elevated, but only up to 37. Will treat medical problems as above and below, and monitor for improvement.   Alcohol abuse - healthcare power of attorney states that he not she has  continued persistently drinking. CIWA protocol   AKI (acute kidney injury) (HCC) - unclear etiology, we'll hydrate gently and monitor improvement.   Umbilical hernia - needs surgical repair which she cannot get here based on prior evaluation. Will get wound care team to make recommendations for proper dressing and management until she can continue to creatinine [facility] for repair of her hernia, unlikely to occur during his hospital stay.   Liver cirrhosis (HCC) - avoid hepatotoxins, monitor transaminases. As of now only AST is mildly elevated.   Anxiety - CIWA protocol includes benzodiazepine as above. We'll hold home anxiolytic for now for the same.  All the records are reviewed and case discussed with ED provider. Management plans discussed with the patient and/or family.  DVT PROPHYLAXIS: mechanical only  ADMISSION STATUS: Inpatient  CODE STATUS: Full  TOTAL TIME TAKING CARE OF THIS PATIENT: 45 minutes.    Azavion Bouillon FIELDING 01/10/2015, 11:16 PM  [facility]  Office  [phone]  CC: Primary care physician; Jaclyn Shaggy, MD

## 2015-01-10 NOTE — ED Notes (Signed)
Pt bib EMS w/ c/o umbilical hernia.  Per EMS, pts family called out for ab pain.  Pts family unable to state how long pt has had hernia.  Pt from home.

## 2015-01-11 DIAGNOSIS — K429 Umbilical hernia without obstruction or gangrene: Secondary | ICD-10-CM

## 2015-01-11 LAB — PROTIME-INR
INR: 1.76
Prothrombin Time: 20.7 seconds — ABNORMAL HIGH (ref 11.4–15.0)

## 2015-01-11 LAB — URINALYSIS COMPLETE WITH MICROSCOPIC (ARMC ONLY)
BILIRUBIN URINE: NEGATIVE
Glucose, UA: NEGATIVE mg/dL
Ketones, ur: NEGATIVE mg/dL
Nitrite: POSITIVE — AB
PH: 7 (ref 5.0–8.0)
PROTEIN: NEGATIVE mg/dL
Specific Gravity, Urine: 1.011 (ref 1.005–1.030)

## 2015-01-11 LAB — COMPREHENSIVE METABOLIC PANEL
ALK PHOS: 79 U/L (ref 38–126)
ALT: 23 U/L (ref 14–54)
ANION GAP: 5 (ref 5–15)
AST: 41 U/L (ref 15–41)
Albumin: 1.7 g/dL — ABNORMAL LOW (ref 3.5–5.0)
BUN: 42 mg/dL — ABNORMAL HIGH (ref 6–20)
CALCIUM: 7.6 mg/dL — AB (ref 8.9–10.3)
CHLORIDE: 112 mmol/L — AB (ref 101–111)
CO2: 24 mmol/L (ref 22–32)
CREATININE: 1.25 mg/dL — AB (ref 0.44–1.00)
GFR, EST AFRICAN AMERICAN: 54 mL/min — AB (ref >60)
GFR, EST NON AFRICAN AMERICAN: 46 mL/min — AB (ref >60)
Glucose, Bld: 104 mg/dL — ABNORMAL HIGH (ref 65–99)
Potassium: 3.8 mmol/L (ref 3.5–5.1)
Sodium: 141 mmol/L (ref 135–145)
Total Bilirubin: 3.3 mg/dL — ABNORMAL HIGH (ref 0.3–1.2)
Total Protein: 5.6 g/dL — ABNORMAL LOW (ref 6.5–8.1)

## 2015-01-11 LAB — CBC
HCT: 20 % — ABNORMAL LOW (ref 35.0–47.0)
Hemoglobin: 6.5 g/dL — ABNORMAL LOW (ref 12.0–16.0)
MCH: 25.6 pg — AB (ref 26.0–34.0)
MCHC: 32.4 g/dL (ref 32.0–36.0)
MCV: 79 fL — AB (ref 80.0–100.0)
PLATELETS: 89 10*3/uL — AB (ref 150–440)
RBC: 2.53 MIL/uL — AB (ref 3.80–5.20)
RDW: 21 % — ABNORMAL HIGH (ref 11.5–14.5)
WBC: 6.9 10*3/uL (ref 3.6–11.0)

## 2015-01-11 LAB — RAPID HIV SCREEN (HIV 1/2 AB+AG)
HIV 1/2 Antibodies: NONREACTIVE
HIV-1 P24 Antigen - HIV24: NONREACTIVE

## 2015-01-11 LAB — HEPATIC FUNCTION PANEL
ALBUMIN: 1.7 g/dL — AB (ref 3.5–5.0)
ALK PHOS: 81 U/L (ref 38–126)
ALT: 23 U/L (ref 14–54)
AST: 39 U/L (ref 15–41)
Bilirubin, Direct: 1.7 mg/dL — ABNORMAL HIGH (ref 0.1–0.5)
Indirect Bilirubin: 2.3 mg/dL — ABNORMAL HIGH (ref 0.3–0.9)
TOTAL PROTEIN: 6 g/dL — AB (ref 6.5–8.1)
Total Bilirubin: 4 mg/dL — ABNORMAL HIGH (ref 0.3–1.2)

## 2015-01-11 LAB — PHOSPHORUS: Phosphorus: 4.8 mg/dL — ABNORMAL HIGH (ref 2.5–4.6)

## 2015-01-11 LAB — MAGNESIUM: Magnesium: 2.2 mg/dL (ref 1.7–2.4)

## 2015-01-11 LAB — PREPARE RBC (CROSSMATCH)

## 2015-01-11 MED ORDER — THIAMINE HCL 100 MG/ML IJ SOLN: 100.0000 mg | Freq: Every day | INTRAMUSCULAR | Status: DC

## 2015-01-11 MED ORDER — LORAZEPAM 2 MG/ML IJ SOLN: 1.0000 mg | Freq: Four times a day (QID) | INTRAMUSCULAR | Status: DC | PRN

## 2015-01-11 MED ORDER — BACITRACIN 500 UNIT/GM EX OINT
TOPICAL_OINTMENT | Freq: Two times a day (BID) | CUTANEOUS | Status: DC
  Administered 2015-01-11 – 2015-01-13 (×5): 1 via TOPICAL
  Filled 2015-01-11 (×8): qty 1

## 2015-01-11 MED ORDER — SODIUM CHLORIDE 0.9 % IV SOLN
80.0000 mg | Freq: Once | INTRAVENOUS | Status: AC
  Administered 2015-01-11: 80 mg via INTRAVENOUS
  Filled 2015-01-11: qty 80

## 2015-01-11 MED ORDER — PANTOPRAZOLE SODIUM 40 MG IV SOLR: 40.0000 mg | Freq: Two times a day (BID) | INTRAVENOUS | Status: DC

## 2015-01-11 MED ORDER — ADULT MULTIVITAMIN W/MINERALS CH
1.0000 | ORAL_TABLET | Freq: Every day | ORAL | Status: DC
  Administered 2015-01-11 – 2015-01-12 (×2): 1 via ORAL
  Filled 2015-01-11 (×2): qty 1

## 2015-01-11 MED ORDER — SODIUM CHLORIDE 0.9 % IJ SOLN
3.0000 mL | Freq: Two times a day (BID) | INTRAMUSCULAR | Status: DC
  Administered 2015-01-11 – 2015-01-13 (×5): 3 mL via INTRAVENOUS

## 2015-01-11 MED ORDER — LORAZEPAM 1 MG PO TABS
1.0000 mg | ORAL_TABLET | Freq: Four times a day (QID) | ORAL | Status: DC | PRN
  Administered 2015-01-13: 1 mg via ORAL
  Filled 2015-01-11: qty 1

## 2015-01-11 MED ORDER — LORAZEPAM 2 MG/ML IJ SOLN: 0.0000 mg | Freq: Four times a day (QID) | INTRAMUSCULAR | Status: AC

## 2015-01-11 MED ORDER — SODIUM CHLORIDE 0.9 % IV SOLN
Freq: Once | INTRAVENOUS | Status: AC
  Administered 2015-01-11: 05:00:00 via INTRAVENOUS

## 2015-01-11 MED ORDER — SODIUM CHLORIDE 0.9 % IV SOLN
Freq: Once | INTRAVENOUS | Status: AC
  Administered 2015-01-11: 11:00:00 via INTRAVENOUS

## 2015-01-11 MED ORDER — M.V.I. ADULT IV INJ
INJECTION | Freq: Once | INTRAVENOUS | Status: AC
  Administered 2015-01-11: 05:00:00 via INTRAVENOUS
  Filled 2015-01-11: qty 1000

## 2015-01-11 MED ORDER — OXYCODONE HCL 5 MG PO TABS: 5.0000 mg | ORAL_TABLET | ORAL | Status: DC | PRN

## 2015-01-11 MED ORDER — ONDANSETRON HCL 4 MG PO TABS: 4.0000 mg | ORAL_TABLET | Freq: Four times a day (QID) | ORAL | Status: DC | PRN

## 2015-01-11 MED ORDER — SPIRONOLACTONE 25 MG PO TABS
50.0000 mg | ORAL_TABLET | Freq: Two times a day (BID) | ORAL | Status: DC
  Administered 2015-01-11 – 2015-01-13 (×6): 50 mg via ORAL
  Filled 2015-01-11 (×6): qty 2

## 2015-01-11 MED ORDER — FOLIC ACID 1 MG PO TABS
1.0000 mg | ORAL_TABLET | Freq: Every day | ORAL | Status: DC
  Administered 2015-01-11 – 2015-01-13 (×3): 1 mg via ORAL
  Filled 2015-01-11 (×3): qty 1

## 2015-01-11 MED ORDER — SODIUM CHLORIDE 0.9 % IV SOLN
8.0000 mg/h | INTRAVENOUS | Status: DC
  Administered 2015-01-11 – 2015-01-12 (×4): 8 mg/h via INTRAVENOUS
  Filled 2015-01-11 (×4): qty 80

## 2015-01-11 MED ORDER — INFLUENZA VAC SPLIT QUAD 0.5 ML IM SUSY
0.5000 mL | PREFILLED_SYRINGE | INTRAMUSCULAR | Status: DC
  Filled 2015-01-11: qty 0.5

## 2015-01-11 MED ORDER — VITAMIN B-1 100 MG PO TABS
100.0000 mg | ORAL_TABLET | Freq: Every day | ORAL | Status: DC
  Administered 2015-01-11 – 2015-01-13 (×3): 100 mg via ORAL
  Filled 2015-01-11 (×3): qty 1

## 2015-01-11 MED ORDER — LORAZEPAM 2 MG/ML IJ SOLN: 0.0000 mg | Freq: Two times a day (BID) | INTRAMUSCULAR | Status: DC

## 2015-01-11 MED ORDER — ONDANSETRON HCL 4 MG/2ML IJ SOLN: 4.0000 mg | Freq: Four times a day (QID) | INTRAMUSCULAR | Status: DC | PRN

## 2015-01-11 NOTE — Consult Note (Signed)
Consultation  Referring Provider:      Admit date Consult date         Reason for Consultation:              HPI:   Stephanie Beck is a 59 y.o. female with history of alcohol abuse, ALD,  HCV, incarcerated umbilical hernia, admitted for concerns of GI Bleeding. Initial hgb was 3.8: there was some bleeding noted at the hernia site and rectal exam revealed trace heme positive stool. Patient states she has had some disomfort around the umbilical hernia, but otherwise has had no abdominal pain, NVD, melena, hematemesis, problems swallowing, nor any other GI complaint. States she had a colonoscopy by Dr Kinnie Scales in [place] some years ago without significant findings. Says she has never had EGD. Denies NSAIDs. Says her last alcoholic beverage was about 2w ago. Not on any PPI at home.  Liverwise reports having had HCV for a number of years. Says she was treated with interferon at one point, but unable to state who, when, for how long, or any other details of the treatment. States she has has not been following with providers for her cirrhosis.  Does report heavy etoh use until 2w ago. Has tattoos, has been incarcerated, and had partners with HIV and likely hepatitis. Denies IVDU/intranasal cocaine. Is on methadone for chronic leg pain, she says. Denies prior blood transfusions, dialysis, episodes of prior jaundice/ascites- although was noted last August that she had jaunidce and ascites: she was admitted again due to her umbilical hernia. Was evaluated by Dr Excell Seltzer, and surgery was deferred due to her chronic liver disease/comorbidities.She was also treated for cellulitis of the abdominal wall at that time, however patient signed out AMA. At that time she was given a prescription for clindamycin and advised to seek a medical center opinion, however she didn't do this.  It was also  recommended, during that hospitalization, that she take nadolol for variceal prophylaxis, but is not doing this. She reports she  has an appointment with Dr Servando Snare to establish liver care on either the 13th or 21st of October. Liverwise, clinical situation is significant for ascites, thrombocytopenia, coagulopathy, jaundice, encephalopathy, hyperbilirubinemia, and  Anemia. Her kidney function is somewhat decreased. She has had drug screens positive for amphetamines, benzodiazepines, and opiates: this is consitent with her medication history.     Past Medical History  Diagnosis Date  . Panic attack   . Hepatitis C 1995  . Bipolar affective (HCC)   . Chronic pain     legs  . Heart murmur   . Hernia, umbilical     Protruding  . Anxiety   . Cirrhosis [facility])     Past Surgical History  Procedure Laterality Date  . Breast surgery  2000  . Liver biopsy  2005    Family History  Problem Relation Age of Onset  . Heart attack Father   . Stroke Mother     Social History  Substance Use Topics  . Smoking status: Current Every Day Smoker -- 1.00 packs/day for 12 years  . Smokeless tobacco: Never Used  . Alcohol Use: Yes    Prior to Admission medications   Medication Sig Start Date End Date Taking? Authorizing Provider  ALPRAZolam Prudy Feeler) 1 MG tablet Take 1 mg by mouth 2 (two) times daily.   Yes Historical Provider, MD  amphetamine-dextroamphetamine (ADDERALL) 30 MG tablet Take 30 mg by mouth daily.   Yes Historical Provider, MD  methadone (DOLOPHINE) 10 MG tablet  Take 20 mg by mouth 3 (three) times daily.    Yes Historical Provider, MD  spironolactone (ALDACTONE) 50 MG tablet Take 1 tablet by mouth 2 (two) times daily. 12/17/14  Yes Historical Provider, MD  triamcinolone cream (KENALOG) 0.1 % Apply 1 application topically 2 (two) times daily.   Yes Historical Provider, MD  clindamycin (CLEOCIN) 300 MG capsule Take 1 capsule (300 mg total) by mouth 3 (three) times daily. Patient not taking: Reported on 01/11/2015 12/02/14   Alford Highland, MD    Current Facility-Administered Medications  Medication Dose Route  Frequency Provider Last Rate Last Dose  . bacitracin ointment   Topical BID Altamese Dilling, MD   1 application at 01/11/15 1109  . folic acid (FOLVITE) tablet 1 mg  1 mg Oral Daily Oralia Manis, MD   1 mg at 01/11/15 1041  . LORazepam (ATIVAN) injection 0-4 mg  0-4 mg Intravenous Q6H Oralia Manis, MD   0 mg at 01/11/15 0451   Followed by  . [START ON 01/13/2015] LORazepam (ATIVAN) injection 0-4 mg  0-4 mg Intravenous Q12H Oralia Manis, MD      . LORazepam (ATIVAN) tablet 1 mg  1 mg Oral Q6H PRN Oralia Manis, MD       Or  . LORazepam (ATIVAN) injection 1 mg  1 mg Intravenous Q6H PRN Oralia Manis, MD      . multivitamin with minerals tablet 1 tablet  1 tablet Oral Q supper Oralia Manis, MD      . ondansetron [facility]) tablet 4 mg  4 mg Oral Q6H PRN Oralia Manis, MD       Or  . ondansetron [facility]) injection 4 mg  4 mg Intravenous Q6H PRN Oralia Manis, MD      . oxyCODONE (Oxy IR/ROXICODONE) immediate release tablet 5 mg  5 mg Oral Q4H PRN Oralia Manis, MD      . pantoprazole (PROTONIX) 80 mg in sodium chloride 0.9 % 250 mL (0.32 mg/mL) infusion  8 mg/hr Intravenous Continuous Oralia Manis, MD 25 mL/hr at 01/11/15 0533 8 mg/hr at 01/11/15 0533  . [START ON 01/14/2015] pantoprazole (PROTONIX) injection 40 mg  40 mg Intravenous Q12H Oralia Manis, MD      . sodium chloride 0.9 % injection 3 mL  3 mL Intravenous Q12H Oralia Manis, MD   3 mL at 01/11/15 0453  . spironolactone (ALDACTONE) tablet 50 mg  50 mg Oral BID Oralia Manis, MD   50 mg at 01/11/15 1109  . thiamine (VITAMIN B-1) tablet 100 mg  100 mg Oral Daily Oralia Manis, MD   100 mg at 01/11/15 1040   Or  . thiamine (B-1) injection 100 mg  100 mg Intravenous Daily Oralia Manis, MD        Allergies as of 01/10/2015 - Review Complete 01/10/2015  Allergen Reaction Noted  . Latex Itching 11/27/2012     Review of Systems:    All systems reviewed and negative except where noted in HPI, however this may be of limited value due to patient's  mentation.    Physical Exam:  Vital signs in last 24 hours: Temp:  [97.9 F (36.6 C)-99.2 F (37.3 C)] 98 F (36.7 C) (10/03 1130) Pulse Rate:  [78-107] 99 (10/03 1130) Resp:  [14-19] 16 (10/03 0115) BP: (116-154)/(55-74) 141/70 mmHg (10/03 1130) SpO2:  [90 %-100 %] 100 % (10/03 1130) Weight:  [55.293 kg (121 lb 14.4 oz)] 55.293 kg (121 lb 14.4 oz) (10/03 0201)   General:   Pleasant ill  appearing woman in NAD Head:  Normocephalic and atraumatic. Eyes:   Mild icterus.   Conjunctiva pink. Ears:  Normal auditory acuity. Mouth: Mucosa pink moist, no lesions. Neck:  Supple; no masses felt Lungs: Respirations even and unlabored. Lungs clear to auscultation bilaterally.   No wheezes, crackles, or rhonchi.  Heart:  S1S2, RRR, no MRG. No edema. Abdomen:   Mildly distended.soft,, nontender. Normal bowel sounds.There is some ascites. Large dressing to umbilicus covers large portion of abdomen. No masses. No rebound signs or other peritoneal signs. Msk:  MAEW x4, No clubbing or cyanosis. Strength 5/5. Symmetrical without gross deformities. Neurologic:  Alert and  oriented x4;  Cranial nerves II-XII intact. There is asterxis. Skin:  Warm, dry, no rashes. She is mildly jaundiced with scattered purpura Psych:  Alert and cooperative. Normal affect.  LAB RESULTS:  Recent Labs  01/10/15 2057 01/11/15 0622  WBC 7.3 6.9  HGB 3.8* 6.5*  HCT 12.6* 20.0*  PLT 114* 89*   BMET  Recent Labs  01/10/15 2057 01/11/15 0622  NA 136 141  K 3.7 3.8  CL 109 112*  CO2 19* 24  GLUCOSE 186* 104*  BUN 52* 42*  CREATININE 1.59* 1.25*  CALCIUM 7.5* 7.6*   LFT  Recent Labs  01/11/15 0622  PROT 5.6*  ALBUMIN 1.7*  AST 41  ALT 23  ALKPHOS 79  BILITOT 3.3*   PT/INR  Recent Labs  01/10/15 2057  LABPROT 21.9*  INR 1.89    STUDIES: No results found.     Impression / Plan:   1. Anemia. Did have a heme positive rectal exam in the ED, however has also had bleeding from her umbilical  hernia. With her cirrhosis, she may have esophageal varices v. Other alcohol related gastropathy. Will discuss EGD with Dr Marva Panda- platelet count and INR will need to be in an acceptable range. Agree with Pantoprazole gtt, following hgb, and transfusions.  2. Cirrhosis: MELD 20, CTP class C. Do think she should keep her appointment with Dr Servando Snare to establish outpatient care for this, with rationale. Strongly encourage alcohol abstinence. She should also discuss treatment of her HCV. Will order HCV/HBV/HIV tests, direct bili.Should have qd liver panel and pt/inr. There is a recent US. 3. Umbilical hernia: saw Dr Juliann Pulse- surgery not recommended due to comorbidities. Says she has appt with a Dr Modesto Charon to discuss furhter. As this may contribute to her anemia, may want to consider medical center opinion. 4. Medical nonadherence  Thank you very much for this consult. These services were provided by Vevelyn Pat, NP-C, in collaboration with Christena Deem, MD, with whom I have discussed this patient in full.   Vevelyn Pat, NP-C  Addendum: In discussion with Dr Marva Panda, we have decided that, as patient has severe anemia, as well as thrombocytopenia, coagulopathy, and is MELD 20 with CTP class C, that she should have transfer to medical center for evaluation for her issues.

## 2015-01-11 NOTE — Progress Notes (Signed)
[facility] - [place] at [facility]   PATIENT NAME: Stephanie Beck    MR#:  [id number]  DATE OF BIRTH:  11-19-1955  SUBJECTIVE:  CHIEF COMPLAINT:   Chief Complaint  Patient presents with  . Hernia     She is alert and oriented now, but not very clear - why she is here , and who called ambulance. Denies any complains- said she always had blood in stool due to hemorrhoids, and asking to get some food.  REVIEW OF SYSTEMS:  CONSTITUTIONAL: No fever, fatigue or weakness.  EYES: No blurred or double vision.  EARS, NOSE, AND THROAT: No tinnitus or ear pain.  RESPIRATORY: No cough, shortness of breath, wheezing or hemoptysis.  CARDIOVASCULAR: No chest pain, orthopnea, edema.  GASTROINTESTINAL: No nausea, vomiting, diarrhea or abdominal pain. Have chronic abdominal hernia. GENITOURINARY: No dysuria, hematuria.  ENDOCRINE: No polyuria, nocturia,  HEMATOLOGY: No anemia, easy bruising or bleeding SKIN: No rash or lesion. MUSCULOSKELETAL: No joint pain or arthritis.   NEUROLOGIC: No tingling, numbness, weakness.  PSYCHIATRY: No anxiety or depression.   ROS  DRUG ALLERGIES:   Allergies  Allergen Reactions  . Latex Itching    VITALS:  Blood pressure 143/69, pulse 101, temperature 97 F (36.1 C), temperature source Axillary, resp. rate 16, weight 55.293 kg (121 lb 14.4 oz), SpO2 100 %.  PHYSICAL EXAMINATION:  GENERAL:  59 y.o.-year-old patient lying in the bed with no acute distress. Thin and appears malnourished. EYES: Pupils equal, round, reactive to light and accommodation. No scleral icterus. Extraocular muscles intact.  HEENT: Head atraumatic, normocephalic. Oropharynx and nasopharynx clear. Conjunctiva pale. NECK:  Supple, no jugular venous distention. No thyroid enlargement, no tenderness.  LUNGS: Normal breath sounds bilaterally, no wheezing, rales,rhonchi or crepitation. No use of accessory muscles of respiration.  CARDIOVASCULAR: S1, S2 normal.  No murmurs, rubs, or gallops.  ABDOMEN: Soft, nontender, nondistended. Bowel sounds present. No organomegaly or mass. Umbilical hernia with a 3-4 inch protrusion- with ulcerated and foul smelling tip present.  EXTREMITIES: No pedal edema, cyanosis, or clubbing.  NEUROLOGIC: Cranial nerves II through XII are intact. Muscle strength 4/5 in all extremities. Sensation intact. Gait not checked. No tremors. PSYCHIATRIC: The patient is alert and oriented x 3.  SKIN: No obvious rash, lesion, or ulcer.   Physical Exam LABORATORY PANEL:   CBC  Recent Labs Lab 01/11/15 0622  WBC 6.9  HGB 6.5*  HCT 20.0*  PLT 89*   ------------------------------------------------------------------------------------------------------------------  Chemistries   Recent Labs Lab 01/10/15 2137 01/11/15 0622  NA  --  141  K  --  3.8  CL  --  112*  CO2  --  24  GLUCOSE  --  104*  BUN  --  42*  CREATININE  --  1.25*  CALCIUM  --  7.6*  MG 2.2  --   AST  --  41  ALT  --  23  ALKPHOS  --  79  BILITOT  --  3.3*   ------------------------------------------------------------------------------------------------------------------  Cardiac Enzymes  Recent Labs Lab 01/10/15 2057  TROPONINI 0.03   ------------------------------------------------------------------------------------------------------------------  RADIOLOGY:  No results found.  ASSESSMENT AND PLAN:   Principal Problem:   GI bleed Active Problems:   Alcohol abuse   Umbilical hernia   Liver cirrhosis (HCC)   Anemia   Anxiety   Acute encephalopathy   AKI (acute kidney injury) (HCC)  * GI bleed -   likely from slow GI bleed, or from slow recurrent bleeding from her ulcerated  umbilical hernia.   heme positive on rectal exam by ER.  Started Protonix drip, and ordered GI consult. Nothing by mouth for now.  * Ac on Ch anemia due to blood loss  hemoglobin down to 3.8. Was 7.3 - 6 weeks ago.  2 units of blood ordered by ED. Was 6.4-  so one more unit ordered in morning.  * Acute encephalopathy -  likely due to her profound anemia and overall malnutrition. Ammonia level elevated, but only up to 37.   Now completely alert and oriented this morning.  * Alcohol abuse - healthcare power of attorney states that , she has continued persistently drinking. CIWA protocol, no withdrawal for now.  * AKI (acute kidney injury) (HCC) - unclear etiology, we'll hydrate gently and monitor improvement.  * Umbilical hernia - needs surgical repair but she is at very high risk- so not a candidate for it as per surgery. Will get wound care team to make recommendations for proper dressing and management until she can continue to follow at [facility] for repair of her hernia.  * Liver cirrhosis (HCC) - avoid hepatotoxins, monitor transaminases. As of now only AST is mildly elevated.    Bilirubin is rising- monitor, GI to follow.   All the records are reviewed and case discussed with Care Management/Social Workerr. Management plans discussed with the patient, family and they are in agreement.  CODE STATUS: full.  TOTAL TIME TAKING CARE OF THIS PATIENT: 35 minutes.   POSSIBLE D/C IN 2-3 DAYS, DEPENDING ON CLINICAL CONDITION.   Altamese Dilling M.D on 01/11/2015   Between 7am to 6pm - Pager - [phone]  After 6pm go to [email or web address] - password [email or web address]  [facility]  Office  [phone]  CC: Primary care physician; Jaclyn Shaggy, MD  Note: This dictation was prepared with Dragon dictation along with smaller phrase technology. Any transcriptional errors that result from this process are unintentional.

## 2015-01-11 NOTE — Progress Notes (Signed)
Pt admitted from ED to Room 210 at approx 0130. Received report from [place] in ED. 1U PRBC infusing at time of admission. This unit finished infusing at 0145.  Pt A&O to self & place. Unable to state year. On admission, pt yelling that she wanted water. Staff educated pt on diet orders and that she is NPO at this time. Pt denies pain. IV access intact & infusing. Medications administered as ordered (See MAR). Admission & Assessment completed. Pt able to answer all admission questions but unable to recall when she last took her home meds. Pt to floor with home meds: aldactone, alprazolam, methadone, & adderall sent to pharmacy. Pharmacy tech to floor to pick up meds. Form completed & signed.  Umbilical hernia covered lightly with guaze & paper tape for protection. Pt noted with numerous skin tears and abrasions all of her body due to a fall she had today before coming to ED. Abrasion on sacrum that was covered with hydrocolloid dressing in ED.  Bright red blood noted in mouth. ED nurse reported the she was chewing on her gum line and in turn caused the bleeding. Used mouth swabs to clean out pt's mouth.  CIWA protocol in use. Pt scored a 5 on admission. Resting in bed quietly at this time.  Oriented pt to room. Education provided on call bell. Will continue to monitor.

## 2015-01-11 NOTE — ED Notes (Signed)
Pt changed and new depends placed, large abrasion and bruising noted to the pt's back as well as a small area at her left shoulder blade area and at her sacral area noted to have skin missing, sacral wound is approx 3.5 cm wide by 2cm in length, this area covered by a hydrocolloid. Dr Anne Hahn aware, pt's umbilical hernia cont to protrude, small amount of bright red blood noted from the pt's bottom left lower gumline

## 2015-01-11 NOTE — Consult Note (Signed)
WOC wound consult note Reason for Consult:Umbilical hernia with protruding omentum coming out at least 5 inches from the opening of the umbilical hernia.  Wound type:Chronic incarcerated umbilical hernia. Erythema and tenderness surrounding hernia and umbilicus.  Pressure Ulcer POA: N/A Measurement: 3 cm x 3 cm opening with protruding omentum. Dark patches present on omentum.  Drainage (amount, consistency, odor) NOne Periwound:Erythema and tenderness Dressing procedure/placement/frequency:Keep hernia moist and covered. Bacitracin ointment to lesions and cover with vaseline gauze. Secure with ABD pad and tape. Change BID.  Will not follow at this time.  Please re-consult if needed.  Maple Hudson RN BSN CWON Pager [phone]

## 2015-01-11 NOTE — Care Management Important Message (Signed)
Important Message  Patient Details  Name: Stephanie Beck MRN: [id number] Date of Birth: 14-Sep-1955   Medicare Important Message Given:  Yes-second notification given    Adonis Huguenin, RN 01/11/2015, 10:46 AM

## 2015-01-11 NOTE — Consult Note (Signed)
Subjective: Patient seen for profound anemia in the setting of chronic hepatitis C and ongoing alcohol abuse. Please see full GI consult by Mrs. London. Patient seen and examined, chart reviewed. . Patient presenting to hospital with complaint of weakness. She denies nausea or vomiting or abdominal pain. She denies having any black bowel movements and states her stools are "normal".  Objective: Vital signs in last 24 hours: Temp:  [97 F (36.1 C)-99.2 F (37.3 C)] 97.7 F (36.5 C) (10/03 1902) Pulse Rate:  [78-107] 91 (10/03 1902) Resp:  [14-19] 18 (10/03 1902) BP: (116-154)/(55-74) 139/74 mmHg (10/03 1902) SpO2:  [90 %-100 %] 100 % (10/03 1902) Weight:  [55.293 kg (121 lb 14.4 oz)] 55.293 kg (121 lb 14.4 oz) (10/03 0201) Blood pressure 139/74, pulse 91, temperature 97.7 F (36.5 C), temperature source Oral, resp. rate 18, weight 55.293 kg (121 lb 14.4 oz), SpO2 100 %.   Intake/Output from previous day: 10/02 0701 - 10/03 0700 In: 1245 [I.V.:665; Blood:580] Out: 0   Intake/Output this shift:     General appearance:  Cachectic appearing 59 year old female no acute distress Resp:  Clear to auscultation Cardio:  Regular rate and rhythm GI: Mild distention generalized, nontender. Bowel sounds are positive. Protuberant umbilicus.Eschar in place with this. Extremities:  No clubbing cyanosis or edema   Lab Results: Results for orders placed or performed during the hospital encounter of 01/10/15 (from the past 24 hour(s))  CBC with Differential     Status: Abnormal   Collection Time: 01/10/15  8:57 PM  Result Value Ref Range   WBC 7.3 3.6 - 11.0 K/uL   RBC 1.58 (L) 3.80 - 5.20 MIL/uL   Hemoglobin 3.8 (LL) 12.0 - 16.0 g/dL   HCT 16.1 (LL) 09.6 - 47.0 %   MCV 80.1 80.0 - 100.0 fL   MCH 24.0 (L) 26.0 - 34.0 pg   MCHC 30.0 (L) 32.0 - 36.0 g/dL   RDW 04.5 (H) 40.9 - 81.1 %   Platelets 114 (L) 150 - 440 K/uL   Neutrophils Relative % 79 %   Lymphocytes Relative 15 %   Monocytes  Relative 4 %   Eosinophils Relative 1 %   Basophils Relative 1 %   Band Neutrophils 0 %   Metamyelocytes Relative 0 %   Myelocytes 0 %   Promyelocytes Absolute 0 %   Blasts 0 %   nRBC 0 0 /100 WBC   Other 0 %   Neutro Abs 5.7 1.4 - 6.5 K/uL   Lymphs Abs 1.1 1.0 - 3.6 K/uL   Monocytes Absolute 0.3 0.2 - 0.9 K/uL   Eosinophils Absolute 0.1 0 - 0.7 K/uL   Basophils Absolute 0.1 0 - 0.1 K/uL   RBC Morphology OVAL MACROCYTES   Comprehensive metabolic panel     Status: Abnormal   Collection Time: 01/10/15  8:57 PM  Result Value Ref Range   Sodium 136 135 - 145 mmol/L   Potassium 3.7 3.5 - 5.1 mmol/L   Chloride 109 101 - 111 mmol/L   CO2 19 (L) 22 - 32 mmol/L   Glucose, Bld 186 (H) 65 - 99 mg/dL   BUN 52 (H) 6 - 20 mg/dL   Creatinine, Ser 9.14 (H) 0.44 - 1.00 mg/dL   Calcium 7.5 (L) 8.9 - 10.3 mg/dL   Total Protein 5.8 (L) 6.5 - 8.1 g/dL   Albumin 1.6 (L) 3.5 - 5.0 g/dL   AST 48 (H) 15 - 41 U/L   ALT 24 14 - 54 U/L  Alkaline Phosphatase 83 38 - 126 U/L   Total Bilirubin 2.2 (H) 0.3 - 1.2 mg/dL   GFR calc non Af Amer 35 (L) >60 mL/min   GFR calc Af Amer 40 (L) >60 mL/min   Anion gap 8 5 - 15  Protime-INR     Status: Abnormal   Collection Time: 01/10/15  8:57 PM  Result Value Ref Range   Prothrombin Time 21.9 (H) 11.4 - 15.0 seconds   INR 1.89   Troponin I     Status: None   Collection Time: 01/10/15  8:57 PM  Result Value Ref Range   Troponin I 0.03 <0.031 ng/mL  Ammonia     Status: Abnormal   Collection Time: 01/10/15  9:37 PM  Result Value Ref Range   Ammonia 37 (H) 9 - 35 umol/L  Prepare RBC     Status: None   Collection Time: 01/10/15  9:37 PM  Result Value Ref Range   Order Confirmation ORDER PROCESSED BY BLOOD BANK   Type and screen     Status: None (Preliminary result)   Collection Time: 01/10/15  9:37 PM  Result Value Ref Range   ABO/RH(D) AB NEG    Antibody Screen NEG    Sample Expiration 01/13/2015    Unit Number [id number]    Blood Component Type RED  CELLS,LR    Unit division 00    Status of Unit REL FROM [facility]    Transfusion Status OK TO TRANSFUSE    Crossmatch Result Compatible    Unit Number [id number]    Blood Component Type RED CELLS,LR    Unit division 00    Status of Unit ISSUED    Transfusion Status OK TO TRANSFUSE    Crossmatch Result Compatible    Unit Number [id number]    Blood Component Type RED CELLS,LR    Unit division 00    Status of Unit ISSUED    Transfusion Status OK TO TRANSFUSE    Crossmatch Result Compatible    Unit Number [id number]    Blood Component Type RBC LR PHER2    Unit division 00    Status of Unit ISSUED    Transfusion Status OK TO TRANSFUSE    Crossmatch Result Compatible    Unit Number [id number]    Blood Component Type RBC, LR IRR    Unit division 00    Status of Unit REL FROM [facility]    Transfusion Status OK TO TRANSFUSE    Crossmatch Result Compatible   Magnesium     Status: None   Collection Time: 01/10/15  9:37 PM  Result Value Ref Range   Magnesium 2.2 1.7 - 2.4 mg/dL  Phosphorus     Status: Abnormal   Collection Time: 01/10/15  9:37 PM  Result Value Ref Range   Phosphorus 4.8 (H) 2.5 - 4.6 mg/dL  Prepare RBC     Status: None   Collection Time: 01/10/15  9:37 PM  Result Value Ref Range   Order Confirmation ORDERS RECEIVED TO CROSSMATCH   CBC     Status: Abnormal   Collection Time: 01/11/15  6:22 AM  Result Value Ref Range   WBC 6.9 3.6 - 11.0 K/uL   RBC 2.53 (L) 3.80 - 5.20 MIL/uL   Hemoglobin 6.5 (L) 12.0 - 16.0 g/dL   HCT 84.1 (L) 66.0 - 63.0 %   MCV 79.0 (L) 80.0 - 100.0 fL   MCH 25.6 (L) 26.0 - 34.0 pg   MCHC 32.4 32.0 - 36.0 g/dL  RDW 21.0 (H) 11.5 - 14.5 %   Platelets 89 (L) 150 - 440 K/uL  Comprehensive metabolic panel     Status: Abnormal   Collection Time: 01/11/15  6:22 AM  Result Value Ref Range   Sodium 141 135 - 145 mmol/L   Potassium 3.8 3.5 - 5.1 mmol/L   Chloride 112 (H) 101 - 111 mmol/L   CO2 24 22 - 32 mmol/L   Glucose, Bld 104 (H) 65 -  99 mg/dL   BUN 42 (H) 6 - 20 mg/dL   Creatinine, Ser 1.61 (H) 0.44 - 1.00 mg/dL   Calcium 7.6 (L) 8.9 - 10.3 mg/dL   Total Protein 5.6 (L) 6.5 - 8.1 g/dL   Albumin 1.7 (L) 3.5 - 5.0 g/dL   AST 41 15 - 41 U/L   ALT 23 14 - 54 U/L   Alkaline Phosphatase 79 38 - 126 U/L   Total Bilirubin 3.3 (H) 0.3 - 1.2 mg/dL   GFR calc non Af Amer 46 (L) >60 mL/min   GFR calc Af Amer 54 (L) >60 mL/min   Anion gap 5 5 - 15  Urinalysis complete, with microscopic     Status: Abnormal   Collection Time: 01/11/15  9:25 AM  Result Value Ref Range   Color, Urine AMBER (A) YELLOW   APPearance CLOUDY (A) CLEAR   Glucose, UA NEGATIVE NEGATIVE mg/dL   Bilirubin Urine NEGATIVE NEGATIVE   Ketones, ur NEGATIVE NEGATIVE mg/dL   Specific Gravity, Urine 1.011 1.005 - 1.030   Hgb urine dipstick 3+ (A) NEGATIVE   pH 7.0 5.0 - 8.0   Protein, ur NEGATIVE NEGATIVE mg/dL   Nitrite POSITIVE (A) NEGATIVE   Leukocytes, UA 3+ (A) NEGATIVE   RBC / HPF TOO NUMEROUS TO COUNT 0 - 5 RBC/hpf   WBC, UA TOO NUMEROUS TO COUNT 0 - 5 WBC/hpf   Bacteria, UA MANY (A) NONE SEEN   Squamous Epithelial / LPF 0-5 (A) NONE SEEN   WBC Clumps PRESENT    Mucous PRESENT   Rapid HIV screen (HIV 1/2 Ab+Ag)     Status: None   Collection Time: 01/11/15  4:04 PM  Result Value Ref Range   HIV-1 P24 Antigen - HIV24 NON REACTIVE NON REACTIVE   HIV 1/2 Antibodies NON REACTIVE NON REACTIVE   Interpretation (HIV Ag Ab)      A non reactive test result means that HIV 1 or HIV 2 antibodies and HIV 1 p24 antigen were not detected in the specimen.  Protime-INR     Status: Abnormal   Collection Time: 01/11/15  4:04 PM  Result Value Ref Range   Prothrombin Time 20.7 (H) 11.4 - 15.0 seconds   INR 1.76   Hepatic function panel     Status: Abnormal   Collection Time: 01/11/15  4:04 PM  Result Value Ref Range   Total Protein 6.0 (L) 6.5 - 8.1 g/dL   Albumin 1.7 (L) 3.5 - 5.0 g/dL   AST 39 15 - 41 U/L   ALT 23 14 - 54 U/L   Alkaline Phosphatase 81 38 -  126 U/L   Total Bilirubin 4.0 (H) 0.3 - 1.2 mg/dL   Bilirubin, Direct 1.7 (H) 0.1 - 0.5 mg/dL   Indirect Bilirubin 2.3 (H) 0.3 - 0.9 mg/dL      Recent Labs  09/60/45 2057 01/11/15 0622  WBC 7.3 6.9  HGB 3.8* 6.5*  HCT 12.6* 20.0*  PLT 114* 89*   BMET  Recent Labs  01/10/15  2057 01/11/15 0622  NA 136 141  K 3.7 3.8  CL 109 112*  CO2 19* 24  GLUCOSE 186* 104*  BUN 52* 42*  CREATININE 1.59* 1.25*  CALCIUM 7.5* 7.6*   LFT  Recent Labs  01/11/15 1604  PROT 6.0*  ALBUMIN 1.7*  AST 39  ALT 23  ALKPHOS 81  BILITOT 4.0*  BILIDIR 1.7*  IBILI 2.3*   PT/INR  Recent Labs  01/10/15 2057 01/11/15 1604  LABPROT 21.9* 20.7*  INR 1.89 1.76   Hepatitis Panel No results for input(s): HEPBSAG, HCVAB, HEPAIGM, HEPBIGM in the last 72 hours. C-Diff No results for input(s): CDIFFTOX in the last 72 hours. No results for input(s): CDIFFPCR in the last 72 hours.   Studies/Results: No results found.  Scheduled Inpatient Medications:   . bacitracin   Topical BID  . folic acid  1 mg Oral Daily  . LORazepam  0-4 mg Intravenous Q6H   Followed by  . [START ON 01/13/2015] LORazepam  0-4 mg Intravenous Q12H  . multivitamin with minerals  1 tablet Oral Q supper  . [START ON 01/14/2015] pantoprazole (PROTONIX) IV  40 mg Intravenous Q12H  . sodium chloride  3 mL Intravenous Q12H  . spironolactone  50 mg Oral BID  . thiamine  100 mg Oral Daily   Or  . thiamine  100 mg Intravenous Daily    Continuous Inpatient Infusions:   . pantoprozole (PROTONIX) infusion 8 mg/hr (01/11/15 1806)    PRN Inpatient Medications:  LORazepam **OR** LORazepam, ondansetron **OR** ondansetron (ZOFRAN) IV, oxyCODONE  Miscellaneous:   Assessment:  1. Patient presenting with profound anemia but denies any history of GI blood loss of significance. He is heme positive however she is also thrombocytopenic a history of cirrhosis. She denies any hematemesis or black bowel movements. She denies any  rectal bleeding. Part of her physical examination shows a umbilical hernia. Patient states that this will swell and at times will leak a clear fluid. This would infer that she is losing ascitic fluid through an umbilical portal. Previous surgical evaluation last consultation that her to be too high risk for repair of this defect. However she is at very high risk for infection, peritonitis through this pathway. Due to this I would not recommend colonoscopy as abdominal pressure would possibly rupture this defect. EGD I feel likely would be high risk as well. Ultimately management of this would be overall beneficial. She has a history of medical noncompliance and left her last hospitalization early AGAINST MEDICAL ADVICE. She is feeling much better after transfusions. Although her ammonia is elevated there is only a mild asterixis and patient seems to be oriented. Due to patient's ongoing alcohol abuse she is not a candidate for consideration of liver transplant although she is a child's Pugh class C cirrhotic with a meld score of 20.  Plan:  1. Would recommend a Medical Center opinion or management of this difficult issue. Would agree with current medications continuing outpatient PPI, spironolactone, twice weekly Cipro 500 mg by mouth for SBP prophylaxis. I would also add Nadolol 20 mg every 5 PM as clinically feasible if tolerated by blood pressure and pulse. Will follow with you.  Christena Deem MD 01/11/2015, 7:56 PM

## 2015-01-11 NOTE — ED Notes (Signed)
Pt resting quietly, no distress noted, report given, cont to monitor

## 2015-01-11 NOTE — Consult Note (Signed)
CC: Anemia, umbilical hernia  HPI: Stephanie Beck is a pleasant 59 yo F with a history of EtOH abuse, hepatitis C and cirrhosis who presented for confusion.  Was also known to be anemic, etiology unknown.  Reported umbilical bleeding at time of admission as well as heme positive stool.  Was seen in August for long standing ulcerated umbilical hernia which was eroded with incarcerated omentum which was granulating.  Reports that her confusion has resolved.  Otherwise no fevers/chills, night sweats, shortness of breath, cough, chest pain, abdominal pain, nausea/vomiting, diarrhea/constipation, dysuria/hematuria.  Active Ambulatory Problems    Diagnosis Date Noted  . Gallbladder polyp 11/27/2012  . Alcohol abuse   . Cellulitis of abdominal wall   . Umbilical hernia 11/30/2014  . Liver cirrhosis (HCC) 11/30/2014  . Ascites 11/30/2014  . Coagulopathy (HCC) 11/30/2014  . Hyponatremia 11/30/2014  . Jaundice 11/30/2014  . Anemia 11/30/2014  . Cellulitis 11/30/2014  . Protein-calorie malnutrition, severe (HCC) 12/02/2014   Resolved Ambulatory Problems    Diagnosis Date Noted  . No Resolved Ambulatory Problems   Past Medical History  Diagnosis Date  . Panic attack   . Hepatitis C 1995  . Bipolar affective (HCC)   . Chronic pain   . Heart murmur   . Hernia, umbilical   . Anxiety   . Cirrhosis [facility])    Past Surgical History  Procedure Laterality Date  . Breast surgery  2000  . Liver biopsy  2005     Medication List    ASK your doctor about these medications        ALPRAZolam 1 MG tablet  Commonly known as:  XANAX  Take 1 mg by mouth 2 (two) times daily.     amphetamine-dextroamphetamine 30 MG tablet  Commonly known as:  ADDERALL  Take 30 mg by mouth daily.     clindamycin 300 MG capsule  Commonly known as:  CLEOCIN  Take 1 capsule (300 mg total) by mouth 3 (three) times daily.     methadone 10 MG tablet  Commonly known as:  DOLOPHINE  Take 20 mg by mouth 3 (three) times  daily.     spironolactone 50 MG tablet  Commonly known as:  ALDACTONE  Take 1 tablet by mouth 2 (two) times daily.     triamcinolone cream 0.1 %  Commonly known as:  KENALOG  Apply 1 application topically 2 (two) times daily.       Allergies  Allergen Reactions  . Latex Itching    Family History  Problem Relation Age of Onset  . Heart attack Father   . Stroke Mother    Social History   Social History  . Marital Status: Widowed    Spouse Name: N/A  . Number of Children: N/A  . Years of Education: N/A   Occupational History  . Not on file.   Social History Main Topics  . Smoking status: Current Every Day Smoker -- 1.00 packs/day for 12 years  . Smokeless tobacco: Never Used  . Alcohol Use: Yes  . Drug Use: No  . Sexual Activity: Not on file   Other Topics Concern  . Not on file   Social History Narrative   ROS: Full ROS obtained, pertinent positives and negatives as above Blood pressure 116/55, pulse 93, temperature 98.5 F (36.9 C), temperature source Oral, resp. rate 16, weight 121 lb 14.4 oz (55.293 kg), SpO2 99 %. GEN: NAD/A&Ox3 FACE: no obvious facial trauma, normal external nose, normal external ears EYES: no  scleral icterus, no conjunctivitis HEAD: normocephalic atraumatic CV: RRR, no MRG RESP: moving air well, lungs clear ABD: soft, nontender, moderately distended, umbilical hernia with granulated protrustion, no obvious bleeding EXT: moving all ext well, strength 5/5 NEURO: cnII-XII grossly intact, sensation intact all 4 ext  Labs: reviewed, significant for Bili 3.3 Albumin 1.7 WBC 6.9 INR 1.9  Imaging: U/S from 8/22 - Moderate ascites  [name] 59 yo who presented with encephalopathy, anemia, intermittently bleeding incarcerated umbilical hernia without obvious bleeding at my examination.  Risks for surgery are significant, as I have calculated patient with at least a Georgiann Cocker Score which places her at a Childs C. In addition, bili increased to  3.3 from admission of 2.2, which I think indicates significant decomposition with this bleeding insult.  Risk for surgery is great, risk for ascites leakage and potential SBP postop are significant as well.  No indication for surgery at this time.  Will continue to follow.

## 2015-01-11 NOTE — Progress Notes (Signed)
Per Dr. Elisabeth Pigeon okay to start patient on clear liquids

## 2015-01-12 DIAGNOSIS — N950 Postmenopausal bleeding: Secondary | ICD-10-CM | POA: Clinically undetermined

## 2015-01-12 LAB — CBC
HCT: 22.1 % — ABNORMAL LOW (ref 35.0–47.0)
Hemoglobin: 7.1 g/dL — ABNORMAL LOW (ref 12.0–16.0)
MCH: 25.6 pg — AB (ref 26.0–34.0)
MCHC: 32.2 g/dL (ref 32.0–36.0)
MCV: 79.6 fL — ABNORMAL LOW (ref 80.0–100.0)
PLATELETS: 76 10*3/uL — AB (ref 150–440)
RBC: 2.77 MIL/uL — AB (ref 3.80–5.20)
RDW: 20.4 % — AB (ref 11.5–14.5)
WBC: 6.5 10*3/uL (ref 3.6–11.0)

## 2015-01-12 LAB — COMPREHENSIVE METABOLIC PANEL
ALT: 22 U/L (ref 14–54)
AST: 36 U/L (ref 15–41)
Albumin: 1.7 g/dL — ABNORMAL LOW (ref 3.5–5.0)
Alkaline Phosphatase: 76 U/L (ref 38–126)
Anion gap: 6 (ref 5–15)
BUN: 31 mg/dL — ABNORMAL HIGH (ref 6–20)
CHLORIDE: 112 mmol/L — AB (ref 101–111)
CO2: 23 mmol/L (ref 22–32)
CREATININE: 1.16 mg/dL — AB (ref 0.44–1.00)
Calcium: 7.8 mg/dL — ABNORMAL LOW (ref 8.9–10.3)
GFR calc non Af Amer: 51 mL/min — ABNORMAL LOW (ref >60)
GFR, EST AFRICAN AMERICAN: 59 mL/min — AB (ref >60)
Glucose, Bld: 93 mg/dL (ref 65–99)
POTASSIUM: 3.4 mmol/L — AB (ref 3.5–5.1)
SODIUM: 141 mmol/L (ref 135–145)
Total Bilirubin: 3.5 mg/dL — ABNORMAL HIGH (ref 0.3–1.2)
Total Protein: 5.7 g/dL — ABNORMAL LOW (ref 6.5–8.1)

## 2015-01-12 LAB — IRON AND TIBC
IRON: 13 ug/dL — AB (ref 28–170)
Saturation Ratios: 5 % — ABNORMAL LOW (ref 10.4–31.8)
TIBC: 271 ug/dL (ref 250–450)
UIBC: 259 ug/dL

## 2015-01-12 LAB — HEPATITIS B SURFACE ANTIBODY, QUANTITATIVE: Hepatitis B-Post: 145.9 m[IU]/mL (ref >9.9)

## 2015-01-12 LAB — IRON: Iron: 24 ug/dL — ABNORMAL LOW (ref 28–170)

## 2015-01-12 LAB — HEPATITIS B SURFACE ANTIGEN: HEP B S AG: NEGATIVE

## 2015-01-12 MED ORDER — DEXTROSE 5 % IV SOLN
1.0000 g | INTRAVENOUS | Status: DC
  Administered 2015-01-12 – 2015-01-13 (×2): 1 g via INTRAVENOUS
  Filled 2015-01-12 (×4): qty 10

## 2015-01-12 MED ORDER — NADOLOL 20 MG PO TABS
20.0000 mg | ORAL_TABLET | Freq: Every day | ORAL | Status: DC
  Administered 2015-01-12: 20 mg via ORAL
  Filled 2015-01-12: qty 1

## 2015-01-12 NOTE — Evaluation (Signed)
Physical Therapy Evaluation Patient Details Name: Stephanie Beck MRN: [id number] DOB: [birth year] Today's Date: 01/12/2015   History of Present Illness  Patient is a 59 y/o female that presents after an apparent fall at home. Patient noted to have confusion on admission. Patient with positive drug screen, GI bleed, alcohol abuse and liver cirrhosis. Patient noted to have Hgb of 3.8.   Clinical Impression  Patient presents after an apparent fall at home. She states she feels somewhat off balance from her normal and demonstrates slight decreases in balance tests compared to what would be expected for her age. She demonstrates decreased balance with higher level tasks such as head turns and repeated sit to stands. She does not lose her balance in this session, though it should be noted her HR was elevated between 115-125 throughout the session. Patient followed commands and responded appropriately throughout this session and would benefit from continued PT services to address her higher level balance deficits.     Follow Up Recommendations Home health PT    Equipment Recommendations       Recommendations for Other Services       Precautions / Restrictions Precautions Precautions: Fall Restrictions Weight Bearing Restrictions: No      Mobility  Bed Mobility Overal bed mobility: Needs Assistance Bed Mobility: Supine to Sit           General bed mobility comments: Prolonged time to transfer supine to sit, secondary to abdominal distension.   Transfers Overall transfer level: Needs assistance   Transfers: Sit to/from Stand Sit to Stand: Supervision         General transfer comment: Patient with minimal posterior loss of balance in standing, on one attempt required several attempts at standing.   Ambulation/Gait Ambulation/Gait assistance: Supervision Ambulation Distance (Feet): 200 Feet   Gait Pattern/deviations: WFL(Within Functional Limits)   Gait velocity  interpretation: <1.8 ft/sec, indicative of risk for recurrent falls General Gait Details: Patient displays no obvious gait abnormalities.   Stairs            Wheelchair Mobility    Modified Rankin (Stroke Patients Only)       Balance Overall balance assessment: Needs assistance   Sitting balance-Leahy Scale: Normal     Standing balance support: No upper extremity supported Standing balance-Leahy Scale: Good Standing balance comment: Tinetti 27/28, 5x sit to stand 20 seconds, modified DGI of 10/12 indicating she is at higher risk of falling than her baseline.                              Pertinent Vitals/Pain Pain Assessment:  (Patient does not comment on any pain throughout the session. )    Home Living Family/patient expects to be discharged to:: Private residence Living Arrangements: Alone   Type of Home: House Home Access:  (A few steps in per her report. )              Prior Function Level of Independence: Independent         Comments: Patient states she does not use any ADs.      Hand Dominance        Extremity/Trunk Assessment   Upper Extremity Assessment: Overall WFL for tasks assessed           Lower Extremity Assessment: Overall WFL for tasks assessed (Command following deficits noted. )         Communication   Communication: No difficulties  Cognition  Arousal/Alertness: Awake/alert Behavior During Therapy: WFL for tasks assessed/performed Overall Cognitive Status: Within Functional Limits for tasks assessed                      General Comments General comments (skin integrity, edema, etc.): Multiple open wounds on her back, chest, legs, and arms.     Exercises Other Exercises Other Exercises: Ambulated 160 feet with cuing for gait speed and head turning trial with cuing for maintaining speed.  Other Exercises: Standing hip abductions x 10 bilaterally  Other Exercises: Standing hip flexion marching x 10  bilaterally.       Assessment/Plan    PT Assessment Patient needs continued PT services  PT Diagnosis Difficulty walking;Generalized weakness   PT Problem List Decreased strength;Decreased activity tolerance;Decreased balance;Decreased mobility  PT Treatment Interventions Gait training;Stair training;Neuromuscular re-education;Balance training;Therapeutic exercise;Therapeutic activities   PT Goals (Current goals can be found in the Care Plan section) Acute Rehab PT Goals Patient Stated Goal: To return home safely  PT Goal Formulation: With patient Time For Goal Achievement: 01/26/15 Potential to Achieve Goals: Good    Frequency Min 2X/week   Barriers to discharge Decreased caregiver support Patient with minimal assistance at home.    Co-evaluation               End of Session Equipment Utilized During Treatment: Gait belt Activity Tolerance: Patient tolerated treatment well Patient left: in chair;with call bell/phone within reach;with chair alarm set Nurse Communication: Mobility status         Time: 6962-9528 PT Time Calculation (min) (ACUTE ONLY): 23 min   Charges:   PT Evaluation $Initial PT Evaluation Tier I: 1 Procedure PT Treatments $Therapeutic Exercise: 8-22 mins   PT G Codes:       Kerin Ransom, PT, DPT    01/12/2015, 4:36 PM

## 2015-01-12 NOTE — Consult Note (Signed)
Obstetrics & Gynecology Consultation Note  Date of Consultation: 01/12/2015   Requesting Provider: Dr. Elisabeth Pigeon, MD  Primary OBGYN: None Primary Care Provider: Jaclyn Shaggy  Reason for Consultation: postmenopausal bleeding  History of Present Illness: 59 y/o G1P0010 (LMP early 44s) with the above CC. PMHx significant for poor compliance, hepatitis C, alcohol abuse, cirrhosis, incarcerated umbilical hernia, anemia, heme positive stool, anemia and thrombocytopenia.  Patient is a a poor historian. Per RN, staff noticed VB/spotting today. Patient states that maybe she noticed some spotting since her periods ended but is unsure when and for how long.  No fevers, chills, chest pain, SOB, belly pain, dysuria, vaginal itching or discharge  ROS: as stated in the above HPI.  OBGYN History: As per HPI. EAB x 1. Patient unsure of last pap but states she possibly had abnormal paps in her 80s  No. history of STIs.   No. HRT use.    Past Medical History: Past Medical History  Diagnosis Date  . Panic attack   . Hepatitis C 1995  . Bipolar affective (HCC)   . Chronic pain     legs  . Heart murmur   . Hernia, umbilical     Protruding  . Anxiety   . Cirrhosis [facility])     Past Surgical History: Past Surgical History  Procedure Laterality Date  . Breast surgery  2000  . Liver biopsy  2005    Family History:  Family History  Problem Relation Age of Onset  . Heart attack Father   . Stroke Mother     Social History:  Social History   Social History  . Marital Status: Widowed    Spouse Name: N/A  . Number of Children: N/A  . Years of Education: N/A   Occupational History  . Not on file.   Social History Main Topics  . Smoking status: Current Every Day Smoker -- 1.00 packs/day for 12 years  . Smokeless tobacco: Never Used  . Alcohol Use: Yes  . Drug Use: No  . Sexual Activity: Not on file   Other Topics Concern  . Not on file   Social History Narrative    Health  Maintenance:  Mammogram: she states she has had some but unsure of when but they were always normal Colonoscopy: yes, 4-5 years ago which patient states was normal  Allergy: Allergies  Allergen Reactions  . Latex Itching    Current Outpatient Medications: xanax, methadone per patient  Hospital Medications: Current Facility-Administered Medications  Medication Dose Route Frequency Provider Last Rate Last Dose  . bacitracin ointment   Topical BID Altamese Dilling, MD   1 application at 01/12/15 1105  . cefTRIAXone (ROCEPHIN) 1 g in dextrose 5 % 50 mL IVPB  1 g Intravenous Q24H Altamese Dilling, MD   1 g at 01/12/15 1433  . folic acid (FOLVITE) tablet 1 mg  1 mg Oral Daily Oralia Manis, MD   1 mg at 01/12/15 1104  . Influenza vac split quadrivalent PF (FLUARIX) injection 0.5 mL  0.5 mL Intramuscular Tomorrow-1000 Altamese Dilling, MD   0.5 mL at 01/12/15 1000  . LORazepam (ATIVAN) injection 0-4 mg  0-4 mg Intravenous Q6H Oralia Manis, MD   0 mg at 01/11/15 0451   Followed by  . [START ON 01/13/2015] LORazepam (ATIVAN) injection 0-4 mg  0-4 mg Intravenous Q12H Oralia Manis, MD      . LORazepam (ATIVAN) tablet 1 mg  1 mg Oral Q6H PRN Oralia Manis, MD  Or  . LORazepam (ATIVAN) injection 1 mg  1 mg Intravenous Q6H PRN Oralia Manis, MD      . multivitamin with minerals tablet 1 tablet  1 tablet Oral Q supper Oralia Manis, MD   1 tablet at 01/12/15 1755  . nadolol (CORGARD) tablet 20 mg  20 mg Oral Daily Altamese Dilling, MD   20 mg at 01/12/15 1754  . ondansetron (ZOFRAN) tablet 4 mg  4 mg Oral Q6H PRN Oralia Manis, MD       Or  . ondansetron [facility]) injection 4 mg  4 mg Intravenous Q6H PRN Oralia Manis, MD      . oxyCODONE (Oxy IR/ROXICODONE) immediate release tablet 5 mg  5 mg Oral Q4H PRN Oralia Manis, MD      . pantoprazole (PROTONIX) 80 mg in sodium chloride 0.9 % 250 mL (0.32 mg/mL) infusion  8 mg/hr Intravenous Continuous Oralia Manis, MD 25 mL/hr at 01/12/15  0634 8 mg/hr at 01/12/15 0634  . [START ON 01/14/2015] pantoprazole (PROTONIX) injection 40 mg  40 mg Intravenous Q12H Oralia Manis, MD      . sodium chloride 0.9 % injection 3 mL  3 mL Intravenous Q12H Oralia Manis, MD   3 mL at 01/12/15 1106  . spironolactone (ALDACTONE) tablet 50 mg  50 mg Oral BID Oralia Manis, MD   50 mg at 01/12/15 1104  . thiamine (VITAMIN B-1) tablet 100 mg  100 mg Oral Daily Oralia Manis, MD   100 mg at 01/12/15 1104   Or  . thiamine (B-1) injection 100 mg  100 mg Intravenous Daily Oralia Manis, MD         Physical Exam:  Current Vital Signs 24h Vital Sign Ranges  T 97.7 F (36.5 C) Temp  Avg: 97.7 F (36.5 C)  Min: 97.7 F (36.5 C)  Max: 97.7 F (36.5 C)  BP (!) 150/85 mmHg BP  Min: 139/74  Max: 150/85  HR (!) 107 Pulse  Avg: 101.6  Min: 91  Max: 115  RR 18 Resp  Avg: 18  Min: 18  Max: 18  SaO2 99 % RA SpO2  Avg: 99 %  Min: 98 %  Max: 100 %       24 Hour I/O Current Shift I/O  Time Ins Outs 10/03 0701 - 10/04 0700 In: 1619 [I.V.:1367] Out: 720 [Urine:720] 10/04 0701 - 10/04 1900 In: 1024 [P.O.:840; I.V.:148] Out: 103 [Urine:102]   Patient Vitals for the past 24 hrs:  BP Temp Temp src Pulse Resp SpO2 Height Weight  01/12/15 1754 (!) 150/85 mmHg - - (!) 107 - - - -  01/12/15 1555 - - - (!) 115 - 99 % - -  01/12/15 1103 (!) 148/84 mmHg - - (!) 101 - - - -  01/12/15 0700 - - - - - 98 % - -  01/12/15 0100 - - - 94 - -  (1.6 m) -  01/11/15 2000 - - - - - - - 120 lb 12.8 oz (54.795 kg)  01/11/15 1902 139/74 mmHg 97.7 F (36.5 C) Oral 91 18 100 % - -    Body mass index is 21.4 kg/(m^2). General appearance: Well nourished, well developed female in no acute distress.  Cardiovascular:Regular rate and rhythm.  No murmurs, rubs or gallops. Respiratory:  Clear to auscultation bilateral. Normal respiratory effort Abdomen: belly wrap in place with umbilical hernia seen Neuro/Psych:  Normal mood and affect.  Skin:  Warm and dry.  Lymphatic:  No inguinal  lymphadenopathy.   Pelvic exam: no gross vaginal bleeding; pad is dry   Laboratory:  Recent Labs Lab 01/10/15 2057 01/11/15 0622 01/12/15 0456  WBC 7.3 6.9 6.5  HGB 3.8* 6.5* 7.1*  HCT 12.6* 20.0* 22.1*  PLT 114* 89* 76*    Recent Labs Lab 01/10/15 2057 01/11/15 0622 01/11/15 1604 01/12/15 0456  NA 136 141  --  141  K 3.7 3.8  --  3.4*  CL 109 112*  --  112*  CO2 19* 24  --  23  BUN 52* 42*  --  31*  CREATININE 1.59* 1.25*  --  1.16*  CALCIUM 7.5* 7.6*  --  7.8*  PROT 5.8* 5.6* 6.0* 5.7*  BILITOT 2.2* 3.3* 4.0* 3.5*  ALKPHOS 83 79 81 76  ALT 24 23 23 22   AST 48* 41 39 36  GLUCOSE 186* 104*  --  93    Recent Labs Lab 01/10/15 2057 01/11/15 1604  INR 1.89 1.76    Recent Labs Lab 01/10/15 2137  ABORH AB NEG    Imaging:  Pelvic u/s pending  Assessment: Ms. Bronk is a 59 y.o. G1P0010 with PMB. Pt currently stable  Plan: -pelvic u/s ordered -since not bleeding heavily, I doubt this is causing her anemia. Given clinically stable, can do outpatient follow up. I stressed to patient importance of outpatient follow up to evaluate for malignancy with pap smear and endometrial biopsy. Patient is amenable to plan -please make 1-2 week post discharge follow up with Consuella Lose for Ms. Hanigan  Thank you for the consult. Please call us with any questions or concerns   Cornelia Copa. MD [facility] Pager [phone]

## 2015-01-12 NOTE — Progress Notes (Signed)
Notified Dr. Vergie Living of consult and order received for transvaginal ultrasound

## 2015-01-12 NOTE — Progress Notes (Signed)
Okay per Dr. Elisabeth Pigeon to place order for PT consult

## 2015-01-12 NOTE — Progress Notes (Signed)
[address] [facility] - [place] at [facility]   PATIENT NAME: Stephanie Beck    MR#:  [id number]  DATE OF BIRTH:  1955-10-31  SUBJECTIVE:  CHIEF COMPLAINT:   Chief Complaint  Patient presents with  . Hernia     She is alert and oriented now, but not very clear - why she is here , and who called ambulance. Denies any complains- said she always had blood in stool due to hemorrhoids, and asking to get some food.  REVIEW OF SYSTEMS:  CONSTITUTIONAL: No fever, fatigue or weakness.  EYES: No blurred or double vision.  EARS, NOSE, AND THROAT: No tinnitus or ear pain.  RESPIRATORY: No cough, shortness of breath, wheezing or hemoptysis.  CARDIOVASCULAR: No chest pain, orthopnea, edema.  GASTROINTESTINAL: No nausea, vomiting, diarrhea or abdominal pain. Have chronic abdominal hernia. GENITOURINARY: No dysuria, hematuria.  ENDOCRINE: No polyuria, nocturia,  HEMATOLOGY: No anemia, easy bruising or bleeding SKIN: No rash or lesion. MUSCULOSKELETAL: No joint pain or arthritis.   NEUROLOGIC: No tingling, numbness, weakness.  PSYCHIATRY: No anxiety or depression.   ROS  DRUG ALLERGIES:   Allergies  Allergen Reactions  . Latex Itching    VITALS:  Blood pressure 150/85, pulse 107, temperature 97.7 F (36.5 C), temperature source Oral, resp. rate 18, height  (1.6 m), weight 54.795 kg (120 lb 12.8 oz), SpO2 99 %.  PHYSICAL EXAMINATION:  GENERAL:  59 y.o.-year-old patient lying in the bed with no acute distress. Thin and appears malnourished. EYES: Pupils equal, round, reactive to light and accommodation. No scleral icterus. Extraocular muscles intact.  HEENT: Head atraumatic, normocephalic. Oropharynx and nasopharynx clear. Conjunctiva pale. NECK:  Supple, no jugular venous distention. No thyroid enlargement, no tenderness.  LUNGS: Normal breath sounds bilaterally, no wheezing, rales,rhonchi or crepitation. No use of accessory muscles of respiration.   CARDIOVASCULAR: S1, S2 normal. Systolic murmurs present.  ABDOMEN: Soft, nontender, nondistended. Bowel sounds present. No organomegaly or mass. Umbilical hernia with a 3-4 inch protrusion- with ulcerated and foul smelling tip present.  EXTREMITIES: No pedal edema, cyanosis, or clubbing.  NEUROLOGIC: Cranial nerves II through XII are intact. Muscle strength 4/5 in all extremities. Sensation intact. Gait not checked. No tremors. PSYCHIATRIC: The patient is alert and oriented x 3.  SKIN: No obvious rash, lesion, or ulcer.   Physical Exam LABORATORY PANEL:   CBC  Recent Labs Lab 01/12/15 0456  WBC 6.5  HGB 7.1*  HCT 22.1*  PLT 76*   ------------------------------------------------------------------------------------------------------------------  Chemistries   Recent Labs Lab 01/10/15 2137  01/12/15 0456  NA  --   < > 141  K  --   < > 3.4*  CL  --   < > 112*  CO2  --   < > 23  GLUCOSE  --   < > 93  BUN  --   < > 31*  CREATININE  --   < > 1.16*  CALCIUM  --   < > 7.8*  MG 2.2  --   --   AST  --   < > 36  ALT  --   < > 22  ALKPHOS  --   < > 76  BILITOT  --   < > 3.5*  < > = values in this interval not displayed. ------------------------------------------------------------------------------------------------------------------  Cardiac Enzymes  Recent Labs Lab 01/10/15 2057  TROPONINI 0.03   ------------------------------------------------------------------------------------------------------------------  RADIOLOGY:  No results found.  ASSESSMENT AND PLAN:   Principal Problem:   GI bleed Active  Problems:   Alcohol abuse   Umbilical hernia   Liver cirrhosis (HCC)   Anemia   Anxiety   Acute encephalopathy   AKI (acute kidney injury) (HCC)   Postmenopausal bleeding  * GI bleed -   likely from slow GI bleed, or from slow recurrent bleeding from her ulcerated   umbilical hernia.   heme positive on rectal exam by ER.  Started Protonix drip, and ordered GI  consult. Nothing by mouth for now.  * Ac on Ch anemia due to blood loss  hemoglobin down to 3.8. Was 7.3 - 6 weeks ago.   2 units of blood ordered by ED.  Was 6.4- so one more unit ordered in morning.  * UTI   Rocephin IV.    Ur cx.  * Acute encephalopathy -  likely due to her profound anemia and overall malnutrition.    Also found to have UTI.  Ammonia level elevated, but only up to 37.   Now completely alert and oriented this morning.  * Alcohol abuse - healthcare power of attorney states that , she has continued persistently drinking. CIWA protocol, no withdrawal for now.  * AKI (acute kidney injury) (HCC) - unclear etiology, we'll hydrate gently and monitor improvement.  * Umbilical hernia - needs surgical repair but she is at very high risk- so not a candidate for it as per surgery. Will get wound care team to make recommendations for proper dressing and management until she can continue to follow at [facility] for repair of her hernia.  * Liver cirrhosis (HCC) - avoid hepatotoxins, monitor transaminases. As of now only AST is mildly elevated.    Bilirubin is rising- monitor, GI to follow.  All the records are reviewed and case discussed with Care Management/Social Workerr. Management plans discussed with the patient, family and they are in agreement.  CODE STATUS: full.  TOTAL TIME TAKING CARE OF THIS PATIENT: 35 minutes.   POSSIBLE D/C IN 2-3 DAYS, DEPENDING ON CLINICAL CONDITION.  Altamese Dilling M.D on 01/12/2015   Between 7am to 6pm - Pager - [phone]  After 6pm go to [email or web address] - password [email or web address]  [facility]  Office  [phone]  CC: Primary care physician; Jaclyn Shaggy, MD  Note: This dictation was prepared with Dragon dictation along with smaller phrase technology. Any transcriptional errors that result from this process are unintentional.

## 2015-01-12 NOTE — Progress Notes (Signed)
Notified Dr. Elisabeth Pigeon of vaginal bleeding. Per Dr. Elisabeth Pigeon please place OBGYN consult.

## 2015-01-12 NOTE — Discharge Instructions (Signed)
° ° °  We will discuss your surgery once again in detail at your post-op visit in two to four weeks. If you haven’t already done so, please call to make your appointment as soon as possible. ° °[place] [facility] [place]  °[address] [place] [address]  °[place], [place] [address] [place], [place] [address]  °Phone: [phone] Phone: [phone]  °Fax: [phone] Fax: [phone]  ° ° ° ° ° ° °

## 2015-01-12 NOTE — Clinical Documentation Improvement (Signed)
Hospitalist  Abnormal Lab/Test Results:  Urine  Possible Clinical Conditions associated with below indicators  UTI  Other Condition  Cannot Clinically Determine   Supporting Information: Urine results - Bacteria - too many to count; Leukocytes - +3; WBC - too many to count  Treatment Provided: On cipro   Please exercise your independent, professional judgment when responding. A specific answer is not anticipated or expected.   Thank Modesta Messing [facility] Health Information Management [place] [phone]

## 2015-01-12 NOTE — Consult Note (Signed)
Subjective: Patient seen for anemia, heme positive stool. Patient has been hemodynamically stable. No evidence of ongoing bleeding with the exception of some vaginal bleeding noted earlier. Patient denies any abdominal pain nausea or vomiting. One bowel movement today normal in color.  Objective: Vital signs in last 24 hours: Pulse Rate:  [94-115] 107 (10/04 1754) BP: (148-150)/(84-85) 150/85 mmHg (10/04 1754) SpO2:  [98 %-99 %] 99 % (10/04 1555) Weight:  [54.795 kg (120 lb 12.8 oz)] 54.795 kg (120 lb 12.8 oz) (10/03 2000) Blood pressure 150/85, pulse 107, temperature 97.7 F (36.5 C), temperature source Oral, resp. rate 18, height  (1.6 m), weight 54.795 kg (120 lb 12.8 oz), SpO2 99 %.   Intake/Output from previous day: 10/03 0701 - 10/04 0700 In: 1619 [I.V.:1367; Blood:252] Out: 720 [Urine:720]  Intake/Output this shift:     General appearance:  Cachectic no acute distress, oriented. Resp:  Clear to auscultation Cardio:  Regular rate and rhythm GI:  Tense positive ascites nontender no rebound bowel sounds are positive normoactive. Extremities:  No clubbing cyanosis or edema   Lab Results: Results for orders placed or performed during the hospital encounter of 01/10/15 (from the past 24 hour(s))  CBC     Status: Abnormal   Collection Time: 01/12/15  4:56 AM  Result Value Ref Range   WBC 6.5 3.6 - 11.0 K/uL   RBC 2.77 (L) 3.80 - 5.20 MIL/uL   Hemoglobin 7.1 (L) 12.0 - 16.0 g/dL   HCT 21.3 (L) 08.6 - 57.8 %   MCV 79.6 (L) 80.0 - 100.0 fL   MCH 25.6 (L) 26.0 - 34.0 pg   MCHC 32.2 32.0 - 36.0 g/dL   RDW 46.9 (H) 62.9 - 52.8 %   Platelets 76 (L) 150 - 440 K/uL  Comprehensive metabolic panel     Status: Abnormal   Collection Time: 01/12/15  4:56 AM  Result Value Ref Range   Sodium 141 135 - 145 mmol/L   Potassium 3.4 (L) 3.5 - 5.1 mmol/L   Chloride 112 (H) 101 - 111 mmol/L   CO2 23 22 - 32 mmol/L   Glucose, Bld 93 65 - 99 mg/dL   BUN 31 (H) 6 - 20 mg/dL    Creatinine, Ser 4.13 (H) 0.44 - 1.00 mg/dL   Calcium 7.8 (L) 8.9 - 10.3 mg/dL   Total Protein 5.7 (L) 6.5 - 8.1 g/dL   Albumin 1.7 (L) 3.5 - 5.0 g/dL   AST 36 15 - 41 U/L   ALT 22 14 - 54 U/L   Alkaline Phosphatase 76 38 - 126 U/L   Total Bilirubin 3.5 (H) 0.3 - 1.2 mg/dL   GFR calc non Af Amer 51 (L) >60 mL/min   GFR calc Af Amer 59 (L) >60 mL/min   Anion gap 6 5 - 15      Recent Labs  01/10/15 2057 01/11/15 0622 01/12/15 0456  WBC 7.3 6.9 6.5  HGB 3.8* 6.5* 7.1*  HCT 12.6* 20.0* 22.1*  PLT 114* 89* 76*   BMET  Recent Labs  01/10/15 2057 01/11/15 0622 01/12/15 0456  NA 136 141 141  K 3.7 3.8 3.4*  CL 109 112* 112*  CO2 19* 24 23  GLUCOSE 186* 104* 93  BUN 52* 42* 31*  CREATININE 1.59* 1.25* 1.16*  CALCIUM 7.5* 7.6* 7.8*   LFT  Recent Labs  01/11/15 1604 01/12/15 0456  PROT 6.0* 5.7*  ALBUMIN 1.7* 1.7*  AST 39 36  ALT 23 22  ALKPHOS 81  76  BILITOT 4.0* 3.5*  BILIDIR 1.7*  --   IBILI 2.3*  --    PT/INR  Recent Labs  01/10/15 2057 01/11/15 1604  LABPROT 21.9* 20.7*  INR 1.89 1.76   Hepatitis Panel  Recent Labs  01/11/15 1604  HEPBSAG Negative   C-Diff No results for input(s): CDIFFTOX in the last 72 hours. No results for input(s): CDIFFPCR in the last 72 hours.   Studies/Results: No results found.  Scheduled Inpatient Medications:   . bacitracin   Topical BID  . cefTRIAXone (ROCEPHIN)  IV  1 g Intravenous Q24H  . folic acid  1 mg Oral Daily  . Influenza vac split quadrivalent PF  0.5 mL Intramuscular Tomorrow-1000  . LORazepam  0-4 mg Intravenous Q6H   Followed by  . [START ON 01/13/2015] LORazepam  0-4 mg Intravenous Q12H  . multivitamin with minerals  1 tablet Oral Q supper  . nadolol  20 mg Oral Daily  . [START ON 01/14/2015] pantoprazole (PROTONIX) IV  40 mg Intravenous Q12H  . sodium chloride  3 mL Intravenous Q12H  . spironolactone  50 mg Oral BID  . thiamine  100 mg Oral Daily   Or  . thiamine  100 mg Intravenous Daily     Continuous Inpatient Infusions:   . pantoprozole (PROTONIX) infusion 8 mg/hr (01/12/15 0634)    PRN Inpatient Medications:  LORazepam **OR** LORazepam, ondansetron **OR** ondansetron (ZOFRAN) IV, oxyCODONE  Miscellaneous:   Assessment:  1. Cirrhosis, in the setting of known history of ongoing alcohol abuse and chronic hepatitis C. Patient is presenting quite cachectic with poor nutrition. Profoundly anemic on admission however denying any source of GI bleeding. Patient states that the umbilical hernia she has leaked a clear fluid at times. Again this would infer a perforation with the abdominal cavity, ascites leak. As such she is at high risk for developing peritonitis. In this setting I would not recommend colonoscopy. Patient is microcytic and likely iron deficient.  Plan:  1 I recommend Medical Center evaluation and opinion as previously stated. Agree with current medical management. 2. Obtain iron studies on admission blood, pretransfusion. 3. Multiple hepatitis serology still pending. 4.  Will follow with you  Christena Deem MD 01/12/2015, 7:12 PM

## 2015-01-13 ENCOUNTER — Inpatient Hospital Stay (HOSPITAL_COMMUNITY): Payer: Self-pay

## 2015-01-13 ENCOUNTER — Inpatient Hospital Stay: Payer: Medicare Other

## 2015-01-13 LAB — COMPREHENSIVE METABOLIC PANEL
ALT: 20 U/L (ref 14–54)
AST: 33 U/L (ref 15–41)
Albumin: 1.5 g/dL — ABNORMAL LOW (ref 3.5–5.0)
Alkaline Phosphatase: 69 U/L (ref 38–126)
Anion gap: 4 — ABNORMAL LOW (ref 5–15)
BILIRUBIN TOTAL: 2.5 mg/dL — AB (ref 0.3–1.2)
BUN: 20 mg/dL (ref 6–20)
CO2: 21 mmol/L — ABNORMAL LOW (ref 22–32)
CREATININE: 0.92 mg/dL (ref 0.44–1.00)
Calcium: 7.3 mg/dL — ABNORMAL LOW (ref 8.9–10.3)
Chloride: 109 mmol/L (ref 101–111)
GFR calc Af Amer: >60 mL/min (ref >60)
Glucose, Bld: 88 mg/dL (ref 65–99)
Potassium: 3.5 mmol/L (ref 3.5–5.1)
Sodium: 134 mmol/L — ABNORMAL LOW (ref 135–145)
TOTAL PROTEIN: 5.4 g/dL — AB (ref 6.5–8.1)

## 2015-01-13 LAB — CBC
HCT: 23.3 % — ABNORMAL LOW (ref 35.0–47.0)
Hemoglobin: 7.6 g/dL — ABNORMAL LOW (ref 12.0–16.0)
MCH: 26.4 pg (ref 26.0–34.0)
MCHC: 32.6 g/dL (ref 32.0–36.0)
MCV: 80.8 fL (ref 80.0–100.0)
PLATELETS: 73 10*3/uL — AB (ref 150–440)
RBC: 2.88 MIL/uL — AB (ref 3.80–5.20)
RDW: 21.2 % — ABNORMAL HIGH (ref 11.5–14.5)
WBC: 8.3 10*3/uL (ref 3.6–11.0)

## 2015-01-13 LAB — TYPE AND SCREEN
ABO/RH(D): AB NEG
Antibody Screen: NEGATIVE
UNIT DIVISION: 0
UNIT DIVISION: 0
UNIT DIVISION: 0
Unit division: 0
Unit division: 0

## 2015-01-13 LAB — HEPATITIS A ANTIBODY, TOTAL: Hep A Total Ab: POSITIVE — AB

## 2015-01-13 LAB — HEPATITIS C GENOTYPE

## 2015-01-13 LAB — COMMENT2 - HEP PANEL

## 2015-01-13 LAB — TRANSFERRIN: Transferrin: 190 mg/dL — ABNORMAL LOW (ref 192–382)

## 2015-01-13 LAB — HEPATITIS C ANTIBODY (REFLEX): HCV Ab: >11 s/co ratio — ABNORMAL HIGH (ref 0.0–0.9)

## 2015-01-13 LAB — HEPATITIS B CORE ANTIBODY, TOTAL: Hep B Core Total Ab: POSITIVE — AB

## 2015-01-13 MED ORDER — CIPROFLOXACIN HCL 500 MG PO TABS
500.0000 mg | ORAL_TABLET | ORAL | Status: AC
Start: 2015-01-13 — End: ?

## 2015-01-13 MED ORDER — CEFUROXIME AXETIL 250 MG PO TABS: 250.0000 mg | ORAL_TABLET | Freq: Two times a day (BID) | ORAL | Status: DC

## 2015-01-13 MED ORDER — ADULT MULTIVITAMIN W/MINERALS CH: 1.0000 | ORAL_TABLET | Freq: Every day | ORAL | Status: DC

## 2015-01-13 MED ORDER — NADOLOL 20 MG PO TABS: 20.0000 mg | ORAL_TABLET | Freq: Every day | ORAL | Status: AC

## 2015-01-13 MED ORDER — PANTOPRAZOLE SODIUM 40 MG PO TBEC: 40.0000 mg | DELAYED_RELEASE_TABLET | Freq: Every day | ORAL | Status: AC

## 2015-01-13 NOTE — Progress Notes (Signed)
Pt discharged to home.Concerns addressed. Dressing to abdominal hernia changed. IV site removed. Discharge summary given to patient.

## 2015-01-13 NOTE — Care Management Note (Signed)
Case Management Note  Patient Details  Name: Stephanie Beck MRN: [id number] Date of Birth: 06-Aug-1955  Subjective/Objective:                 Patient presents from home with a GI bleed.  Patient lives at home.  A friend is currently living at the residence.  Patient states that she obtains her medications from [facility].  Patient states that there are no barriers in obtaining her medications.  PT has recommended Home health PT.  Home health preference list provided to patient and [facility] selected.    Action/Plan: [name] from [facility] notified of referral.  MD has entered home health order, and completed home health face to face.  RNCM signing off.   Expected Discharge Date:                  Expected Discharge Plan:     In-House Referral:     Discharge planning Services     Post Acute Care Choice:    Choice offered to:     DME Arranged:    DME Agency:     HH Arranged:    HH Agency:     Status of Service:     Medicare Important Message Given:  Yes-third notification given Date Medicare IM Given:    Medicare IM give by:    Date Additional Medicare IM Given:    Additional Medicare Important Message give by:     If discussed at Long Length of Stay Meetings, dates discussed:    Additional Comments:  Chapman Fitch, RN 01/13/2015, 2:42 PM

## 2015-01-13 NOTE — Consult Note (Signed)
Subjective: Patient seen for anemia and Hemoccult-positive stool. Patient is currently doing well. She has been hemodynamically stable and is tolerating regular diet. She is desirous of going home. She denies any nausea or abdominal pain. She did have a bowel movement late last night was brown.  Objective: Vital signs in last 24 hours: Temp:  [98.5 F (36.9 C)-98.7 F (37.1 C)] 98.5 F (36.9 C) (10/05 0509) Pulse Rate:  [71-115] 75 (10/05 0509) BP: (103-150)/(62-85) 103/62 mmHg (10/05 0509) SpO2:  [99 %-100 %] 100 % (10/05 0509) Blood pressure 103/62, pulse 75, temperature 98.5 F (36.9 C), temperature source Oral, resp. rate 18, height  (1.6 m), weight 54.795 kg (120 lb 12.8 oz), SpO2 100 %.   Intake/Output from previous day: 10/04 0701 - 10/05 0700 In: 1319.5 [P.O.:840; I.V.:443.5; IV Piggyback:36] Out: 503 [Urine:502; Stool:1]  Intake/Output this shift: Total I/O In: 960 [P.O.:960] Out: -    General appearance:  Cachectic-appearing 59 year old female no acute distress Resp:  Clear to auscultation Cardio:  Regular rate and rhythm GI:  Ascites moderately tense but not protuberant. Nontender no rebound. Umbilical herniation noted. No leakage from this reported. Extremities:  Thin no clubbing cyanosis or edema   Lab Results: Results for orders placed or performed during the hospital encounter of 01/10/15 (from the past 24 hour(s))  Urine culture     Status: None (Preliminary result)   Collection Time: 01/12/15  7:47 PM  Result Value Ref Range   Specimen Description URINE, CLEAN CATCH    Special Requests Normal    Culture TOO YOUNG TO READ    Report Status PENDING   Iron     Status: Abnormal   Collection Time: 01/12/15 10:05 PM  Result Value Ref Range   Iron 24 (L) 28 - 170 ug/dL  CBC     Status: Abnormal   Collection Time: 01/13/15  5:25 AM  Result Value Ref Range   WBC 8.3 3.6 - 11.0 K/uL   RBC 2.88 (L) 3.80 - 5.20 MIL/uL   Hemoglobin 7.6 (L) 12.0 - 16.0 g/dL    HCT 91.4 (L) 78.2 - 47.0 %   MCV 80.8 80.0 - 100.0 fL   MCH 26.4 26.0 - 34.0 pg   MCHC 32.6 32.0 - 36.0 g/dL   RDW 95.6 (H) 21.3 - 08.6 %   Platelets 73 (L) 150 - 440 K/uL  Comprehensive metabolic panel     Status: Abnormal   Collection Time: 01/13/15  5:25 AM  Result Value Ref Range   Sodium 134 (L) 135 - 145 mmol/L   Potassium 3.5 3.5 - 5.1 mmol/L   Chloride 109 101 - 111 mmol/L   CO2 21 (L) 22 - 32 mmol/L   Glucose, Bld 88 65 - 99 mg/dL   BUN 20 6 - 20 mg/dL   Creatinine, Ser 5.78 0.44 - 1.00 mg/dL   Calcium 7.3 (L) 8.9 - 10.3 mg/dL   Total Protein 5.4 (L) 6.5 - 8.1 g/dL   Albumin 1.5 (L) 3.5 - 5.0 g/dL   AST 33 15 - 41 U/L   ALT 20 14 - 54 U/L   Alkaline Phosphatase 69 38 - 126 U/L   Total Bilirubin 2.5 (H) 0.3 - 1.2 mg/dL   GFR calc non Af Amer >60 >60 mL/min   GFR calc Af Amer >60 >60 mL/min   Anion gap 4 (L) 5 - 15      Recent Labs  01/11/15 0622 01/12/15 0456 01/13/15 0525  WBC 6.9 6.5 8.3  HGB 6.5* 7.1* 7.6*  HCT 20.0* 22.1* 23.3*  PLT 89* 76* 73*   BMET  Recent Labs  01/11/15 0622 01/12/15 0456 01/13/15 0525  NA 141 141 134*  K 3.8 3.4* 3.5  CL 112* 112* 109  CO2 24 23 21*  GLUCOSE 104* 93 88  BUN 42* 31* 20  CREATININE 1.25* 1.16* 0.92  CALCIUM 7.6* 7.8* 7.3*   LFT  Recent Labs  01/11/15 1604  01/13/15 0525  PROT 6.0*  < > 5.4*  ALBUMIN 1.7*  < > 1.5*  AST 39  < > 33  ALT 23  < > 20  ALKPHOS 81  < > 69  BILITOT 4.0*  < > 2.5*  BILIDIR 1.7*  --   --   IBILI 2.3*  --   --   < > = values in this interval not displayed. PT/INR  Recent Labs  01/10/15 2057 01/11/15 1604  LABPROT 21.9* 20.7*  INR 1.89 1.76   Hepatitis Panel  Recent Labs  01/11/15 1604  HEPBSAG Negative   C-Diff No results for input(s): CDIFFTOX in the last 72 hours. No results for input(s): CDIFFPCR in the last 72 hours.   Studies/Results: US Transvaginal Non-ob  01/13/2015   CLINICAL DATA:  Vaginal bleeding for 2 days  EXAM: TRANSABDOMINAL AND  TRANSVAGINAL ULTRASOUND OF PELVIS  TECHNIQUE: Both transabdominal and transvaginal ultrasound examinations of the pelvis were performed. Transabdominal technique was performed for global imaging of the pelvis including uterus, ovaries, adnexal regions, and pelvic cul-de-sac. It was necessary to proceed with endovaginal exam following the transabdominal exam to visualize the ovaries.  COMPARISON:  11/30/2014  FINDINGS: Uterus  Measurements: 6.1 x 2.3 x 3.4 cm. No fibroids or other mass visualized.  Endometrium  Thickness: 16 mm.  No focal abnormality visualized.  Right ovary  Not well seen  Left ovary  Not well seen  Other findings  Significant ascites is noted within the pelvis. This is similar to that noted on 11/30/14  IMPRESSION: Nonvisualization of the ovaries.  Large amount of ascites.  Prominent endometrium likely related to the current clinical history.   Electronically Signed   By: Alcide Clever M.D.   On: 01/13/2015 11:18   US Pelvis Complete  01/13/2015   CLINICAL DATA:  Vaginal bleeding for 2 days  EXAM: TRANSABDOMINAL AND TRANSVAGINAL ULTRASOUND OF PELVIS  TECHNIQUE: Both transabdominal and transvaginal ultrasound examinations of the pelvis were performed. Transabdominal technique was performed for global imaging of the pelvis including uterus, ovaries, adnexal regions, and pelvic cul-de-sac. It was necessary to proceed with endovaginal exam following the transabdominal exam to visualize the ovaries.  COMPARISON:  11/30/2014  FINDINGS: Uterus  Measurements: 6.1 x 2.3 x 3.4 cm. No fibroids or other mass visualized.  Endometrium  Thickness: 16 mm.  No focal abnormality visualized.  Right ovary  Not well seen  Left ovary  Not well seen  Other findings  Significant ascites is noted within the pelvis. This is similar to that noted on 11/30/14  IMPRESSION: Nonvisualization of the ovaries.  Large amount of ascites.  Prominent endometrium likely related to the current clinical history.   Electronically Signed    By: Alcide Clever M.D.   On: 01/13/2015 11:18    Scheduled Inpatient Medications:   . bacitracin   Topical BID  . cefTRIAXone (ROCEPHIN)  IV  1 g Intravenous Q24H  . folic acid  1 mg Oral Daily  . Influenza vac split quadrivalent PF  0.5 mL Intramuscular Tomorrow-1000  .  LORazepam  0-4 mg Intravenous Q12H  . multivitamin with minerals  1 tablet Oral Q supper  . nadolol  20 mg Oral Daily  . [START ON 01/14/2015] pantoprazole (PROTONIX) IV  40 mg Intravenous Q12H  . sodium chloride  3 mL Intravenous Q12H  . spironolactone  50 mg Oral BID  . thiamine  100 mg Oral Daily   Or  . thiamine  100 mg Intravenous Daily    Continuous Inpatient Infusions:   . pantoprozole (PROTONIX) infusion 8 mg/hr (01/13/15 0548)    PRN Inpatient Medications:  LORazepam **OR** LORazepam, ondansetron **OR** ondansetron (ZOFRAN) IV, oxyCODONE  Miscellaneous:   Assessment:  1. Cirrhosis in the setting of ongoing alcohol abuse. 2. Anemia uncertain etiology. Patient is not a good candidate for colonoscopy due to large umbilical herniation that at times leaks ascitic fluid. No evidence of overt GI bleeding during this hospitalization. 3. Vaginal bleeding, appreciate GYN evaluation.  Plan:  1. Further testing could include paracentesis with chemistries and CT scan of the abdomen and pelvis to rule out  space taking lesions. She does have a follow-up appointment scheduled per only at [facility] as well as Dr. Servando Snare. Case discussed with Dr. Esaw Grandchild. She has wanted go home as soon as possible. These further evaluations could be done as an outpatient. She is also welcome to follow up in my GI clinic as well. Alcohol cessation counseled earlier in the hospitalization. Would continue diuretic, prophylactic Cipro and nadolol.  Christena Deem MD 01/13/2015, 1:23 PM

## 2015-01-13 NOTE — Progress Notes (Signed)
Pt home medications given back to pt at discharge

## 2015-01-13 NOTE — Discharge Summary (Signed)
[facility] [place] [facility]   PATIENT NAME: Stephanie Beck    MR#:  [id number]  DATE OF BIRTH:  [birth year]  DATE OF ADMISSION:  01/10/2015 ADMITTING PHYSICIAN: Oralia Manis, MD  DATE OF DISCHARGE: 01/13/2015  PRIMARY CARE PHYSICIAN: Jaclyn Shaggy, MD    ADMISSION DIAGNOSIS:  Confusion [R41.0] Iron deficiency anemia due to chronic blood loss [D50.0]  DISCHARGE DIAGNOSIS:  Principal Problem:   GI bleed Active Problems:   Alcohol abuse   Umbilical hernia   Liver cirrhosis (HCC)   Anemia   Anxiety   Acute encephalopathy   AKI (acute kidney injury) (HCC)   Postmenopausal bleeding   SECONDARY DIAGNOSIS:   Past Medical History  Diagnosis Date  . Panic attack   . Hepatitis C 1995  . Bipolar affective (HCC)   . Chronic pain     legs  . Heart murmur   . Hernia, umbilical     Protruding  . Anxiety   . Cirrhosis [facility])     HOSPITAL COURSE:   * GI bleed -  likely from slow GI bleed, or from slow recurrent bleeding from her ulcerated umbilical hernia.  heme positive on rectal exam by ER. Started Protonix drip, and ordered GI consult. Tolerated liquids and then soft diet.   As Hb was stable and pt asymptomatic without new bleeds, and pt was very high risk for any procedures-  Dr. Ricki Rodriguez suggested conservative management and advised to follow with Dr. Servando Snare- she have appointment on 01/21/15. And need to follow with [facility] so she can have proper care/ surgery- being a high risk for a smaller hospital like ours.    Started on nadolol , spironolactone and cipro prophylaxis for ascites to prevent SBP.  * Ac on Ch anemia due to blood loss hemoglobin down to 3.8. Was 7.3 - 6 weeks ago.  2 units of blood ordered by ED. Was 6.4- so one more unit ordered 01/12/15- stable now.  * UTI  Rocephin IV.  Ur cx.    wil give cefuroxime on d/c.  * Acute encephalopathy - likely due to her profound anemia and overall malnutrition.   Also  found to have UTI. Ammonia level elevated, but only up to 37.  Now completely alert and oriented this morning.  * Alcohol abuse - healthcare power of attorney states that , she has continued persistently drinking. CIWA protocol, no withdrawal for now.   Councelled her to stop alcohol, she agreed.  * AKI (acute kidney injury) (HCC) - unclear etiology, we'll hydrate gently and monitor improvement. Improved.  * Umbilical hernia - needs surgical repair but she is at very high risk- so not a candidate for it as per surgery. Will get wound care team to make recommendations for proper dressing and management until she can continue to follow at [facility] for repair of her hernia.  * Liver cirrhosis (HCC) - avoid hepatotoxins, monitor transaminases. As of now only AST is mildly elevated.  Bilirubin is rising- monitor, Gi seen - conservative management and out pt follow ups.  DISCHARGE CONDITIONS:   Stable.  CONSULTS OBTAINED:  Treatment Team:  Christena Deem, MD Warba Bing, MD  DRUG ALLERGIES:   Allergies  Allergen Reactions  . Latex Itching    DISCHARGE MEDICATIONS:   Current Discharge Medication List    START taking these medications   Details  cefUROXime (CEFTIN) 250 MG tablet Take 1 tablet (250 mg total) by mouth 2 (two) times daily with a meal. Qty:  8 tablet, Refills: 0    ciprofloxacin (CIPRO) 500 MG tablet Take 1 tablet (500 mg total) by mouth 2 (two) times a week. Given for SBP prophylaxis. Qty: 40 tablet, Refills: 0    Multiple Vitamin (MULTIVITAMIN WITH MINERALS) TABS tablet Take 1 tablet by mouth daily with supper. Qty: 50 tablet, Refills: 0    nadolol (CORGARD) 20 MG tablet Take 1 tablet (20 mg total) by mouth daily. Qty: 30 tablet, Refills: 1    pantoprazole (PROTONIX) 40 MG tablet Take 1 tablet (40 mg total) by mouth daily. Qty: 60 tablet, Refills: 0      CONTINUE these medications which have NOT CHANGED   Details  ALPRAZolam (XANAX) 1 MG tablet Take  1 mg by mouth 2 (two) times daily.    amphetamine-dextroamphetamine (ADDERALL) 30 MG tablet Take 30 mg by mouth daily.    methadone (DOLOPHINE) 10 MG tablet Take 20 mg by mouth 3 (three) times daily.     spironolactone (ALDACTONE) 50 MG tablet Take 1 tablet by mouth 2 (two) times daily.    triamcinolone cream (KENALOG) 0.1 % Apply 1 application topically 2 (two) times daily.    clindamycin (CLEOCIN) 300 MG capsule Take 1 capsule (300 mg total) by mouth 3 (three) times daily. Qty: 30 capsule, Refills: 0         DISCHARGE INSTRUCTIONS:    Follow with Dr. Servando Snare in 1 week and PMD in 2 weeks. Need to go to [facility] for future purposes, start following in clinic there.  If you experience worsening of your admission symptoms, develop shortness of breath, life threatening emergency, suicidal or homicidal thoughts you must seek medical attention immediately by calling 911 or calling your MD immediately  if symptoms less severe.  You Must read complete instructions/literature along with all the possible adverse reactions/side effects for all the Medicines you take and that have been prescribed to you. Take any new Medicines after you have completely understood and accept all the possible adverse reactions/side effects.   Please note  You were cared for by a hospitalist during your hospital stay. If you have any questions about your discharge medications or the care you received while you were in the hospital after you are discharged, you can call the unit and asked to speak with the hospitalist on call if the hospitalist that took care of you is not available. Once you are discharged, your primary care physician will handle any further medical issues. Please note that NO REFILLS for any discharge medications will be authorized once you are discharged, as it is imperative that you return to your primary care physician (or establish a relationship with a primary care physician if you do not have one) for  your aftercare needs so that they can reassess your need for medications and monitor your lab values.    Today   CHIEF COMPLAINT:   Chief Complaint  Patient presents with  . Hernia    HISTORY OF PRESENT ILLNESS:  Clotilda Hafer  is a 59 y.o. female presents with acute encephalopathy. Patient comes in with a complaint of protruding and bleeding umbilical hernia with abdominal pain. However, her healthcare power of attorney who is present with her states that for the last 3 days she has been unable to complete her activities of daily living, and has been intermittently confused in a waxing and waning fashion. Patient denies any symptoms other than her umbilical hernia. She states that she feels fine and wants to go home. However on  evaluation in the ED she was found to have a hemoglobin of 3.8. She was heme positive on rectal exam. Hospitalists were called for admission for profound anemia requiring transfusion, and question of GI bleeding versus ulcerated slow bleed from her umbilical hernia. Of note she has a history of heavy alcohol abuse, recent diagnosis of liver cirrhosis from alcohol and hep C. Her power of attorney also states that she was recently diagnosed with some abdominal mass, which is felt to be a driving factor behind her hernia. He states that she was seen here for the hernia in the recent past, was told that she would need to have it repaired similarly [facility] or [facility]. Patient is unable to confirm or deny melena, but denies any hematemesis.   VITAL SIGNS:  Blood pressure 98/59, pulse 82, temperature 99.1 F (37.3 C), temperature source Oral, resp. rate 16, height 5\' 3"  (1.6 m), weight 54.795 kg (120 lb 12.8 oz), SpO2 98 %.  I/O:    Intake/Output Summary (Last 24 hours) at 01/13/15 1418 Last data filed at 01/13/15 1209  Gross per 24 hour  Intake 1334.54 ml  Output    603 ml  Net 731.54 ml    PHYSICAL EXAMINATION:   GENERAL: 59 y.o.-year-old patient lying in the bed  with no acute distress. Thin and appears malnourished. EYES: Pupils equal, round, reactive to light and accommodation. No scleral icterus. Extraocular muscles intact.  HEENT: Head atraumatic, normocephalic. Oropharynx and nasopharynx clear. Conjunctiva pale. NECK: Supple, no jugular venous distention. No thyroid enlargement, no tenderness.  LUNGS: Normal breath sounds bilaterally, no wheezing, rales,rhonchi or crepitation. No use of accessory muscles of respiration.  CARDIOVASCULAR: S1, S2 normal. Systolic murmurs present.  ABDOMEN: Soft, nontender, nondistended. Bowel sounds present. No organomegaly or mass. Umbilical hernia with a 3-4 inch protrusion- with ulcerated and foul smelling tip present.  EXTREMITIES: No pedal edema, cyanosis, or clubbing.  NEUROLOGIC: Cranial nerves II through XII are intact. Muscle strength 4/5 in all extremities. Sensation intact. Gait not checked. No tremors. PSYCHIATRIC: The patient is alert and oriented x 3.  SKIN: No obvious rash, lesion, or ulcer.  DATA REVIEW:   CBC  Recent Labs Lab 01/13/15 0525  WBC 8.3  HGB 7.6*  HCT 23.3*  PLT 73*    Chemistries   Recent Labs Lab 01/10/15 2137  01/13/15 0525  NA  --   < > 134*  K  --   < > 3.5  CL  --   < > 109  CO2  --   < > 21*  GLUCOSE  --   < > 88  BUN  --   < > 20  CREATININE  --   < > 0.92  CALCIUM  --   < > 7.3*  MG 2.2  --   --   AST  --   < > 33  ALT  --   < > 20  ALKPHOS  --   < > 69  BILITOT  --   < > 2.5*  < > = values in this interval not displayed.  Cardiac Enzymes  Recent Labs Lab 01/10/15 2057  TROPONINI 0.03    Microbiology Results  Results for orders placed or performed during the hospital encounter of 01/10/15  Urine culture     Status: None (Preliminary result)   Collection Time: 01/12/15  7:47 PM  Result Value Ref Range Status   Specimen Description URINE, CLEAN CATCH  Final   Special Requests Normal  Final  Culture TOO YOUNG TO READ  Final   Report  Status PENDING  Incomplete    RADIOLOGY:  US Transvaginal Non-ob  01/13/2015   CLINICAL DATA:  Vaginal bleeding for 2 days  EXAM: TRANSABDOMINAL AND TRANSVAGINAL ULTRASOUND OF PELVIS  TECHNIQUE: Both transabdominal and transvaginal ultrasound examinations of the pelvis were performed. Transabdominal technique was performed for global imaging of the pelvis including uterus, ovaries, adnexal regions, and pelvic cul-de-sac. It was necessary to proceed with endovaginal exam following the transabdominal exam to visualize the ovaries.  COMPARISON:  11/30/2014  FINDINGS: Uterus  Measurements: 6.1 x 2.3 x 3.4 cm. No fibroids or other mass visualized.  Endometrium  Thickness: 16 mm.  No focal abnormality visualized.  Right ovary  Not well seen  Left ovary  Not well seen  Other findings  Significant ascites is noted within the pelvis. This is similar to that noted on 11/30/14  IMPRESSION: Nonvisualization of the ovaries.  Large amount of ascites.  Prominent endometrium likely related to the current clinical history.   Electronically Signed   By: Alcide Clever M.D.   On: 01/13/2015 11:18   US Pelvis Complete  01/13/2015   CLINICAL DATA:  Vaginal bleeding for 2 days  EXAM: TRANSABDOMINAL AND TRANSVAGINAL ULTRASOUND OF PELVIS  TECHNIQUE: Both transabdominal and transvaginal ultrasound examinations of the pelvis were performed. Transabdominal technique was performed for global imaging of the pelvis including uterus, ovaries, adnexal regions, and pelvic cul-de-sac. It was necessary to proceed with endovaginal exam following the transabdominal exam to visualize the ovaries.  COMPARISON:  11/30/2014  FINDINGS: Uterus  Measurements: 6.1 x 2.3 x 3.4 cm. No fibroids or other mass visualized.  Endometrium  Thickness: 16 mm.  No focal abnormality visualized.  Right ovary  Not well seen  Left ovary  Not well seen  Other findings  Significant ascites is noted within the pelvis. This is similar to that noted on 11/30/14  IMPRESSION:  Nonvisualization of the ovaries.  Large amount of ascites.  Prominent endometrium likely related to the current clinical history.   Electronically Signed   By: Alcide Clever M.D.   On: 01/13/2015 11:18     Management plans discussed with the patient, family and they are in agreement.  CODE STATUS:     Code Status Orders        Start     Ordered   01/11/15 0150  Full code   Continuous     01/11/15 0149      TOTAL TIME TAKING CARE OF THIS PATIENT: 35 minutes.    Altamese Dilling M.D on 01/13/2015 at 2:18 PM  Between 7am to 6pm - Pager - [phone]  After 6pm go to [email or web address] - password [email or web address]  [facility]  Office  [phone]  CC: Primary care physician; Jaclyn Shaggy, MD   Note: This dictation was prepared with Dragon dictation along with smaller phrase technology. Any transcriptional errors that result from this process are unintentional.

## 2015-01-14 LAB — URINE CULTURE: SPECIAL REQUESTS: NORMAL

## 2015-01-19 ENCOUNTER — Emergency Department: Payer: Medicare Other

## 2015-01-19 ENCOUNTER — Emergency Department
Admission: EM | Admit: 2015-01-19 | Discharge: 2015-01-19 | Disposition: A | Discharge status: Other Acute Inpatient Hospital | Payer: Medicare Other | Attending: Emergency Medicine | Admitting: Emergency Medicine

## 2015-01-19 ENCOUNTER — Inpatient Hospital Stay (HOSPITAL_COMMUNITY)
Admission: AD | Admit: 2015-01-19 | Discharge: 2015-01-22 | DRG: 689 | Disposition: A | Discharge status: Home Health | Payer: Medicare Other | Source: Other Acute Inpatient Hospital | Attending: Internal Medicine | Admitting: Internal Medicine

## 2015-01-19 ENCOUNTER — Encounter: Payer: Self-pay | Admitting: Emergency Medicine

## 2015-01-19 DIAGNOSIS — Z792 Long term (current) use of antibiotics: Secondary | ICD-10-CM | POA: Insufficient documentation

## 2015-01-19 DIAGNOSIS — K7031 Alcoholic cirrhosis of liver with ascites: Secondary | ICD-10-CM | POA: Diagnosis not present

## 2015-01-19 DIAGNOSIS — F1010 Alcohol abuse, uncomplicated: Secondary | ICD-10-CM | POA: Diagnosis not present

## 2015-01-19 DIAGNOSIS — R410 Disorientation, unspecified: Secondary | ICD-10-CM | POA: Diagnosis not present

## 2015-01-19 DIAGNOSIS — R188 Other ascites: Secondary | ICD-10-CM | POA: Diagnosis present

## 2015-01-19 DIAGNOSIS — R680 Hypothermia, not associated with low environmental temperature: Secondary | ICD-10-CM | POA: Diagnosis not present

## 2015-01-19 DIAGNOSIS — Z720 Tobacco use: Secondary | ICD-10-CM | POA: Diagnosis not present

## 2015-01-19 DIAGNOSIS — R4182 Altered mental status, unspecified: Secondary | ICD-10-CM | POA: Diagnosis present

## 2015-01-19 DIAGNOSIS — F1310 Sedative, hypnotic or anxiolytic abuse, uncomplicated: Secondary | ICD-10-CM | POA: Diagnosis not present

## 2015-01-19 DIAGNOSIS — B952 Enterococcus as the cause of diseases classified elsewhere: Secondary | ICD-10-CM | POA: Diagnosis not present

## 2015-01-19 DIAGNOSIS — F419 Anxiety disorder, unspecified: Secondary | ICD-10-CM | POA: Diagnosis not present

## 2015-01-19 DIAGNOSIS — R451 Restlessness and agitation: Secondary | ICD-10-CM | POA: Diagnosis not present

## 2015-01-19 DIAGNOSIS — F1510 Other stimulant abuse, uncomplicated: Secondary | ICD-10-CM | POA: Insufficient documentation

## 2015-01-19 DIAGNOSIS — A419 Sepsis, unspecified organism: Secondary | ICD-10-CM

## 2015-01-19 DIAGNOSIS — G9340 Encephalopathy, unspecified: Secondary | ICD-10-CM | POA: Diagnosis present

## 2015-01-19 DIAGNOSIS — L8990 Pressure ulcer of unspecified site, unspecified stage: Secondary | ICD-10-CM | POA: Insufficient documentation

## 2015-01-19 DIAGNOSIS — Z79899 Other long term (current) drug therapy: Secondary | ICD-10-CM | POA: Insufficient documentation

## 2015-01-19 DIAGNOSIS — Z91040 Latex allergy status: Secondary | ICD-10-CM | POA: Diagnosis not present

## 2015-01-19 DIAGNOSIS — E871 Hypo-osmolality and hyponatremia: Secondary | ICD-10-CM | POA: Diagnosis not present

## 2015-01-19 DIAGNOSIS — D649 Anemia, unspecified: Secondary | ICD-10-CM | POA: Diagnosis not present

## 2015-01-19 DIAGNOSIS — L89152 Pressure ulcer of sacral region, stage 2: Secondary | ICD-10-CM | POA: Diagnosis present

## 2015-01-19 DIAGNOSIS — T40695A Adverse effect of other narcotics, initial encounter: Secondary | ICD-10-CM | POA: Diagnosis not present

## 2015-01-19 DIAGNOSIS — E43 Unspecified severe protein-calorie malnutrition: Secondary | ICD-10-CM | POA: Diagnosis not present

## 2015-01-19 DIAGNOSIS — R600 Localized edema: Secondary | ICD-10-CM | POA: Insufficient documentation

## 2015-01-19 DIAGNOSIS — K420 Umbilical hernia with obstruction, without gangrene: Secondary | ICD-10-CM | POA: Diagnosis present

## 2015-01-19 DIAGNOSIS — B1920 Unspecified viral hepatitis C without hepatic coma: Secondary | ICD-10-CM | POA: Diagnosis not present

## 2015-01-19 DIAGNOSIS — N390 Urinary tract infection, site not specified: Secondary | ICD-10-CM | POA: Diagnosis present

## 2015-01-19 DIAGNOSIS — F17200 Nicotine dependence, unspecified, uncomplicated: Secondary | ICD-10-CM | POA: Diagnosis not present

## 2015-01-19 DIAGNOSIS — Z79891 Long term (current) use of opiate analgesic: Secondary | ICD-10-CM | POA: Diagnosis not present

## 2015-01-19 DIAGNOSIS — K7460 Unspecified cirrhosis of liver: Secondary | ICD-10-CM | POA: Diagnosis present

## 2015-01-19 DIAGNOSIS — I959 Hypotension, unspecified: Secondary | ICD-10-CM | POA: Diagnosis present

## 2015-01-19 DIAGNOSIS — K922 Gastrointestinal hemorrhage, unspecified: Secondary | ICD-10-CM | POA: Diagnosis present

## 2015-01-19 DIAGNOSIS — N179 Acute kidney failure, unspecified: Secondary | ICD-10-CM | POA: Diagnosis present

## 2015-01-19 DIAGNOSIS — L03311 Cellulitis of abdominal wall: Secondary | ICD-10-CM | POA: Diagnosis present

## 2015-01-19 DIAGNOSIS — K429 Umbilical hernia without obstruction or gangrene: Secondary | ICD-10-CM

## 2015-01-19 LAB — URINE DRUG SCREEN, QUALITATIVE (ARMC ONLY)
Amphetamines, Ur Screen: POSITIVE — AB
Barbiturates, Ur Screen: NOT DETECTED
Benzodiazepine, Ur Scrn: POSITIVE — AB
COCAINE METABOLITE, UR ~~LOC~~: NOT DETECTED
Cannabinoid 50 Ng, Ur ~~LOC~~: NOT DETECTED
MDMA (ECSTASY) UR SCREEN: NOT DETECTED
Methadone Scn, Ur: POSITIVE — AB
OPIATE, UR SCREEN: NOT DETECTED
Phencyclidine (PCP) Ur S: NOT DETECTED
TRICYCLIC, UR SCREEN: NOT DETECTED

## 2015-01-19 LAB — URINALYSIS COMPLETE WITH MICROSCOPIC (ARMC ONLY)
BILIRUBIN URINE: NEGATIVE
GLUCOSE, UA: NEGATIVE mg/dL
Ketones, ur: NEGATIVE mg/dL
Leukocytes, UA: NEGATIVE
Nitrite: NEGATIVE
Protein, ur: NEGATIVE mg/dL
SPECIFIC GRAVITY, URINE: 1.005 (ref 1.005–1.030)
pH: 6 (ref 5.0–8.0)

## 2015-01-19 LAB — CBC WITH DIFFERENTIAL/PLATELET
Basophils Absolute: 0.1 10*3/uL (ref 0–0.1)
Basophils Relative: 2 %
EOS ABS: 0.3 10*3/uL (ref 0–0.7)
EOS PCT: 5 %
HCT: 25.8 % — ABNORMAL LOW (ref 35.0–47.0)
HEMOGLOBIN: 8.3 g/dL — AB (ref 12.0–16.0)
LYMPHS ABS: 1.9 10*3/uL (ref 1.0–3.6)
Lymphocytes Relative: 28 %
MCH: 26.9 pg (ref 26.0–34.0)
MCHC: 32 g/dL (ref 32.0–36.0)
MCV: 83.9 fL (ref 80.0–100.0)
MONO ABS: 0.9 10*3/uL (ref 0.2–0.9)
MONOS PCT: 14 %
Neutro Abs: 3.5 10*3/uL (ref 1.4–6.5)
Neutrophils Relative %: 51 %
PLATELETS: 114 10*3/uL — AB (ref 150–440)
RBC: 3.08 MIL/uL — ABNORMAL LOW (ref 3.80–5.20)
RDW: 23.3 % — AB (ref 11.5–14.5)
WBC: 6.8 10*3/uL (ref 3.6–11.0)

## 2015-01-19 LAB — COMPREHENSIVE METABOLIC PANEL
ALT: 21 U/L (ref 14–54)
ANION GAP: 4 — AB (ref 5–15)
AST: 46 U/L — ABNORMAL HIGH (ref 15–41)
Albumin: 1.8 g/dL — ABNORMAL LOW (ref 3.5–5.0)
Alkaline Phosphatase: 85 U/L (ref 38–126)
BUN: 24 mg/dL — ABNORMAL HIGH (ref 6–20)
CALCIUM: 7.7 mg/dL — AB (ref 8.9–10.3)
CO2: 19 mmol/L — ABNORMAL LOW (ref 22–32)
CREATININE: 1.2 mg/dL — AB (ref 0.44–1.00)
Chloride: 104 mmol/L (ref 101–111)
GFR, EST AFRICAN AMERICAN: 57 mL/min — AB (ref >60)
GFR, EST NON AFRICAN AMERICAN: 49 mL/min — AB (ref >60)
Glucose, Bld: 70 mg/dL (ref 65–99)
Potassium: 4.5 mmol/L (ref 3.5–5.1)
Sodium: 127 mmol/L — ABNORMAL LOW (ref 135–145)
TOTAL PROTEIN: 6.1 g/dL — AB (ref 6.5–8.1)
Total Bilirubin: 2.2 mg/dL — ABNORMAL HIGH (ref 0.3–1.2)

## 2015-01-19 LAB — PREPARE RBC (CROSSMATCH)

## 2015-01-19 LAB — ETHANOL

## 2015-01-19 LAB — SALICYLATE LEVEL: Salicylate Lvl: <4 mg/dL (ref 2.8–30.0)

## 2015-01-19 LAB — PROTIME-INR
INR: 1.47
PROTHROMBIN TIME: 18 s — AB (ref 11.4–15.0)

## 2015-01-19 LAB — ACETAMINOPHEN LEVEL

## 2015-01-19 LAB — LACTIC ACID, PLASMA
LACTIC ACID, VENOUS: 1.3 mmol/L (ref 0.5–2.0)
Lactic Acid, Venous: 0.8 mmol/L (ref 0.5–2.0)

## 2015-01-19 LAB — LIPASE, BLOOD: LIPASE: 59 U/L — AB (ref 22–51)

## 2015-01-19 LAB — AMMONIA: AMMONIA: 34 umol/L (ref 9–35)

## 2015-01-19 MED ORDER — HALOPERIDOL LACTATE 5 MG/ML IJ SOLN: 5.0000 mg | Freq: Once | INTRAMUSCULAR | Status: AC

## 2015-01-19 MED ORDER — HALOPERIDOL LACTATE 5 MG/ML IJ SOLN
INTRAMUSCULAR | Status: AC
  Administered 2015-01-19: 5 mg via INTRAVENOUS
  Filled 2015-01-19: qty 1

## 2015-01-19 MED ORDER — ONDANSETRON HCL 4 MG/2ML IJ SOLN
4.0000 mg | Freq: Four times a day (QID) | INTRAMUSCULAR | Status: DC | PRN
Start: 2015-01-19 — End: 2015-01-22

## 2015-01-19 MED ORDER — VANCOMYCIN HCL IN DEXTROSE 1-5 GM/200ML-% IV SOLN
1000.0000 mg | Freq: Once | INTRAVENOUS | Status: AC
  Administered 2015-01-19: 1000 mg via INTRAVENOUS
  Filled 2015-01-19: qty 200

## 2015-01-19 MED ORDER — SODIUM CHLORIDE 0.9 % IV SOLN: 250.0000 mL | INTRAVENOUS | Status: DC | PRN

## 2015-01-19 MED ORDER — SODIUM CHLORIDE 0.9 % IV SOLN: 10.0000 mL/h | Freq: Once | INTRAVENOUS | Status: DC

## 2015-01-19 MED ORDER — PIPERACILLIN-TAZOBACTAM 3.375 G IVPB
3.3750 g | Freq: Three times a day (TID) | INTRAVENOUS | Status: DC
Start: 2015-01-20 — End: 2015-01-21
  Administered 2015-01-20 – 2015-01-21 (×4): 3.375 g via INTRAVENOUS
  Filled 2015-01-19 (×7): qty 50

## 2015-01-19 MED ORDER — HEPARIN SODIUM (PORCINE) 5000 UNIT/ML IJ SOLN
5000.0000 [IU] | Freq: Three times a day (TID) | INTRAMUSCULAR | Status: DC
  Administered 2015-01-20 – 2015-01-22 (×8): 5000 [IU] via SUBCUTANEOUS
  Filled 2015-01-19 (×7): qty 1

## 2015-01-19 MED ORDER — SODIUM CHLORIDE 0.9 % IV BOLUS (SEPSIS)
500.0000 mL | Freq: Once | INTRAVENOUS | Status: AC
  Administered 2015-01-19: 500 mL via INTRAVENOUS

## 2015-01-19 MED ORDER — VANCOMYCIN HCL 500 MG IV SOLR
500.0000 mg | Freq: Two times a day (BID) | INTRAVENOUS | Status: DC
  Administered 2015-01-20 (×2): 500 mg via INTRAVENOUS
  Filled 2015-01-19 (×4): qty 500

## 2015-01-19 MED ORDER — FAMOTIDINE IN NACL 20-0.9 MG/50ML-% IV SOLN
20.0000 mg | Freq: Two times a day (BID) | INTRAVENOUS | Status: DC
Start: 2015-01-19 — End: 2015-01-21
  Administered 2015-01-20 – 2015-01-21 (×4): 20 mg via INTRAVENOUS
  Filled 2015-01-19 (×5): qty 50

## 2015-01-19 MED ORDER — PIPERACILLIN-TAZOBACTAM 3.375 G IVPB
3.3750 g | Freq: Once | INTRAVENOUS | Status: AC
  Administered 2015-01-19: 3.375 g via INTRAVENOUS
  Filled 2015-01-19: qty 50

## 2015-01-19 NOTE — ED Notes (Signed)
bair hugger on patient

## 2015-01-19 NOTE — ED Provider Notes (Signed)
[facility] Emergency Department Provider Note  ____________________________________________  Time seen: 2:20 PM on arrival by EMS  I have reviewed the triage vital signs and the nursing notes.   HISTORY  Chief Complaint Altered Mental Status  History Limited by patient agitation and confusion  HPI Stephanie Beck is a 59 y.o. female brought to the ED by EMS for altered mental status. EMS was called by home health nurse who is visiting the patient. Patient was confused and agitated and reluctant to come to the emergency room it was initially convinced by her Primary care doctor to be transported to the ER by EMS.  Patient denies any complaints.     Past Medical History  Diagnosis Date  . Panic attack   . Hepatitis C 1995  . Bipolar affective (HCC)   . Chronic pain     legs  . Heart murmur   . Hernia, umbilical     Protruding  . Anxiety   . Cirrhosis [facility])      Patient Active Problem List   Diagnosis Date Noted  . Postmenopausal bleeding 01/12/2015  . GI bleed 01/10/2015  . Anxiety 01/10/2015  . Acute encephalopathy 01/10/2015  . AKI (acute kidney injury) (HCC) 01/10/2015  . Protein-calorie malnutrition, severe (HCC) 12/02/2014  . Umbilical hernia 11/30/2014  . Liver cirrhosis (HCC) 11/30/2014  . Ascites 11/30/2014  . Coagulopathy (HCC) 11/30/2014  . Hyponatremia 11/30/2014  . Jaundice 11/30/2014  . Anemia 11/30/2014  . Cellulitis 11/30/2014  . Alcohol abuse   . Cellulitis of abdominal wall   . Gallbladder polyp 11/27/2012     Past Surgical History  Procedure Laterality Date  . Breast surgery  2000  . Liver biopsy  2005     Current Outpatient Rx  Name  Route  Sig  Dispense  Refill  . ALPRAZolam (XANAX) 1 MG tablet   Oral   Take 1 mg by mouth 2 (two) times daily.         Marland Kitchen amphetamine-dextroamphetamine (ADDERALL) 30 MG tablet   Oral   Take 30 mg by mouth daily.         . cefUROXime (CEFTIN) 250 MG tablet    Oral   Take 1 tablet (250 mg total) by mouth 2 (two) times daily with a meal.   8 tablet   0   . ciprofloxacin (CIPRO) 500 MG tablet   Oral   Take 1 tablet (500 mg total) by mouth 2 (two) times a week. Given for SBP prophylaxis.   40 tablet   0   . clindamycin (CLEOCIN) 300 MG capsule   Oral   Take 1 capsule (300 mg total) by mouth 3 (three) times daily. Patient not taking: Reported on 01/11/2015   30 capsule   0   . methadone (DOLOPHINE) 10 MG tablet   Oral   Take 20 mg by mouth 3 (three) times daily.          . Multiple Vitamin (MULTIVITAMIN WITH MINERALS) TABS tablet   Oral   Take 1 tablet by mouth daily with supper.   50 tablet   0   . nadolol (CORGARD) 20 MG tablet   Oral   Take 1 tablet (20 mg total) by mouth daily.   30 tablet   1   . pantoprazole (PROTONIX) 40 MG tablet   Oral   Take 1 tablet (40 mg total) by mouth daily.   60 tablet   0   . spironolactone (ALDACTONE) 50 MG  tablet   Oral   Take 1 tablet by mouth 2 (two) times daily.         Marland Kitchen triamcinolone cream (KENALOG) 0.1 %   Topical   Apply 1 application topically 2 (two) times daily.            Allergies Latex   Family History  Problem Relation Age of Onset  . Heart attack Father   . Stroke Mother     Social History Social History  Substance Use Topics  . Smoking status: Current Every Day Smoker -- 1.00 packs/day for 12 years  . Smokeless tobacco: Never Used  . Alcohol Use: Yes    Review of Systems  Constitutional:   No fever or chills. No weight changes Eyes:   No blurry vision or double vision.  ENT:   No sore throat. Cardiovascular:   No chest pain. Respiratory:   No dyspnea or cough. Gastrointestinal:   Negative for abdominal pain, vomiting and diarrhea.  No BRBPR or melena. Genitourinary:   Negative for dysuria, urinary retention, bloody urine, or difficulty urinating. Musculoskeletal:   Negative for back pain. No joint swelling or pain. Skin:   Negative for  rash. Neurological:   Negative for headaches, focal weakness or numbness. Psychiatric:  No anxiety or depression.   Endocrine:  No hot/cold intolerance, changes in energy, or sleep difficulty.  10-point ROS otherwise negative.  ____________________________________________   PHYSICAL EXAM:  VITAL SIGNS: ED Triage Vitals  Enc Vitals Group     BP 01/19/15 1441 100/57 mmHg     Pulse Rate 01/19/15 1441 68     Resp 01/19/15 1441 16     Temp 01/19/15 1441 95.4 F (35.2 C)     Temp Source 01/19/15 1441 Rectal     SpO2 --      Weight --      Height --      Head Cir --      Peak Flow --      Pain Score 01/19/15 1442 0     Pain Loc --      Pain Edu? --      Excl. in GC? --      Constitutional:   Alert, agitated, confused. Ill appearing. Eyes:   No scleral icterus. No conjunctival pallor. PERRL. EOMI ENT   Head:   Normocephalic and atraumatic.   Nose:   No congestion/rhinnorhea. No septal hematoma   Mouth/Throat:   Dry cracked oral mucosa, no pharyngeal erythema. No peritonsillar mass. No uvula shift.   Neck:   No stridor. No SubQ emphysema. No meningismus. Hematological/Lymphatic/Immunilogical:   No cervical lymphadenopathy. Cardiovascular:   RRR. Normal and symmetric distal pulses are present in all extremities. No murmurs, rubs, or gallops. Respiratory:   Normal respiratory effort without tachypnea nor retractions. Breath sounds are clear and equal bilaterally. No wheezes/rales/rhonchi. Gastrointestinal:   Soft with suprapubic tenderness.large umbilical hernia No distention. There is no CVA tenderness.  No rebound, rigidity, or guarding. Genitourinary:   deferred Musculoskeletal:   Nontender with normal range of motion in all extremities. No joint effusions.  No lower extremity tenderness.  1+ daily pitting edema bilaterally Neurologic:   Moaning but responds to questioning appropriately.  CN 2-10 normal. Motor grossly intact.  No gross focal neurologic  deficits are appreciated.  Skin:    Skin is warm, dry and intact. No rash noted.  No petechiae, purpura, or bullae. Psychiatric:   Mood and affect are normal. Speech and behavior are normal. Patient exhibits  appropriate insight and judgment.  ____________________________________________    LABS (pertinent positives/negatives) (all labs ordered are listed, but only abnormal results are displayed) Labs Reviewed  CULTURE, BLOOD (ROUTINE X 2)  CULTURE, BLOOD (ROUTINE X 2)  URINE CULTURE  COMPREHENSIVE METABOLIC PANEL  ETHANOL  LIPASE, BLOOD  LACTIC ACID, PLASMA  LACTIC ACID, PLASMA  CBC WITH DIFFERENTIAL/PLATELET  URINALYSIS COMPLETEWITH MICROSCOPIC ([facility] ONLY)  URINE DRUG SCREEN, QUALITATIVE ([facility] ONLY)  SALICYLATE LEVEL  ACETAMINOPHEN LEVEL  PROTIME-INR  AMMONIA   ____________________________________________   EKG    ____________________________________________    RADIOLOGY  Chest x-ray pending  ____________________________________________   PROCEDURES   ____________________________________________   INITIAL IMPRESSION / ASSESSMENT AND PLAN / ED COURSE  Pertinent labs & imaging results that were available during my care of the patient were reviewed by me and considered in my medical decision making (see chart for details).  Patient presents with confusion and altered mental status, possible delirium due to underlying infection. No evidence of any significant cellulitis or abscess or other soft tissue infection on exam. Possible cystitis or pyelonephritis. We'll check labs chest x-ray urinalysis and give IV fluids. Care of the patient is signed out to oncoming physician Dr. Huel Cote.     ____________________________________________   FINAL CLINICAL IMPRESSION(S) / ED DIAGNOSES  Final diagnoses:  Agitation  Confusion      Sharman Cheek, MD 01/19/15 [phone]

## 2015-01-19 NOTE — ED Notes (Signed)
Patient states that Stephanie Beck is her Healthcare Power of [address]. Name verifed by Laney Pastor, Charge Nurse.  Stephanie Beck' phone number is: [phone]

## 2015-01-19 NOTE — ED Provider Notes (Signed)
----------------------------------------- 9:34 PM on 01/19/2015 -----------------------------------------   Blood pressure 95/44, pulse 69, temperature 97.6 F (36.4 C), temperature source Other (Comment), resp. rate 10, height  (1.575 m), weight 133 lb 5 oz (60.47 kg), SpO2 100 %.  Assuming care from Dr. Scotty Court.  In short, Stephanie Beck is a 59 y.o. female with a chief complaint of Altered Mental Status .  Refer to the original H&P for additional details.  The current plan of care is to transfer the patient to [name] intensive care unit  ED course I assumed care of the patient and have taken care of her before in the past. From today's medical standpoint she arrives hypothermic along with chronic anemia with a history of cirrhosis the patient according to her health care power of attorney may be abusing her pain medications at home. Patient arrives confused with an altered mental status and is only oriented 1. Extensive workup was initiated which included head CT and laboratory work. The patient's differential was widespread based on her past medical history etc. Of note is that she has some hyponatremia, anemia, and was initiated on a septic workup for hypothermia. Patient was started on a bear hugger and is gradually had an increase in her temperature. She was initiated on a blood transfusion for her anemia which I felt would also stabilize her blood pressure. She's had occasional hypotension while here in emergency department. The patient has a  lactic acid is negative and her white blood cell count overall was negative. The patient will require ICU care and currently we have no intensive care unit beds available at [facility]. I spoke to the intensivist at [facility] as this was the hospital selected by your health care power of attorney for transfer. Patient will be initiated on a blood transfusion as soon as it is available. I'll use a blood warmer due to her hypothermia.  The patient most likely require critical care transport due to her multiple medical issues at this time.  As far as her social care the patient's had cycles of admission here to the hospital on occasion his left AGAINST MEDICAL ADVICE. Reviewing her history and with her health care power of attorney it was felt that he needed more capabilities of making her medical decisions. We both feel that the patient's no longer able to copy only take care of herself at home. She does have a home health nurse to help her with her large umbilical hernia which is been determined by our surgeons that she is not an operable candidate based on her history of cirrhosis etc. The gentleman who is her health care provider returning plans on talking to the magistrate and filling out the appropriate paperwork to take responsibility of all of her medical and home decisions. The patient does not have any family available and this is a gentleman that she has stated to be a health care power of attorney in the past and I have spoken to him here at the hospital on this intensive care unit admission and also on a previous visit here to the hospital. He states her primary physician is also going to initiate hospice care evaluation at home. The patient wishes to continue her care at home and does not want to be in the hospital and let continuously has to activate EMS system to take care of her medical needs. The patient's private attorney's name is [name]: [phone]   EKG:ED ECG REPORT I, [name], the attending  physician, personally viewed and interpreted this ECG.  Date: 01/19/2015 EKG Time: 1549 Rate: 63 Rhythm: normal sinus rhythm QRS Axis: normal Intervals: normal ST/T Wave abnormalities: normal Conduction Disutrbances: none Narrative Interpretation: unremarkable    CRITICAL CARE Performed by: [name]   Total critical care time: 75 minutes  Critical care time was exclusive of separately  billable procedures and treating other patients.  Critical care was necessary to treat or prevent imminent or life-threatening deterioration.  Critical care was time spent personally by me on the following activities: development of treatment plan with patient and/or surrogate as well as nursing, discussions with consultants, evaluation of patient's response to treatment, examination of patient, obtaining history from patient or surrogate, ordering and performing treatments and interventions, ordering and review of laboratory studies, ordering and review of radiographic studies, pulse oximetry and re-evaluation of patient's condition. Workup of altered mental status. Discussions with the patient's power of attorney. ICU transfer etc.  [name], MD 01/19/15 [phone]

## 2015-01-19 NOTE — H&P (Signed)
PULMONARY / CRITICAL CARE MEDICINE   Name: Stephanie Beck MRN: [id number] DOB: 1955-08-09    ADMISSION DATE:  01/19/2015 CONSULTATION DATE:  01/19/2015  REFERRING MD :  [facility]  CHIEF COMPLAINT:  Altered Mental Status  INITIAL PRESENTATION: 59yo female with history of hep C cirrhosis, chronic ulcerated umbilical hernia, alcohol abuse, and anxiety who presented to [facility] ED with altered mental status and hypothermia likely secondary to sepsis due to cellulitis of her abdominal wall and ulcerated hernia.   STUDIES:  Head CT 01/19/2015 - No acute abnormalities CXR 01/19/2015 - Mild bibasilar atelectasis  SIGNIFICANT EVENTS: 01/19/2015 - Admit   HISTORY OF PRESENT ILLNESS:  Patient is a 59 year old woman with history of hep C cirrhosis, chronic ulcerated umbilical hernia, alcohol abuse, and anxiety who presented to the [facility] ED with altered mental status in the setting of abdominal wall cellulitis and hypothermia. Patient was brought to the ED by EMS who was called by the patient's home health nurse. Work up there was significant for hyponatremia (127), acidosis (CO2 19), and anemia (Hgb 8.3), with normal UA, CXR, and head CT. Patient was transmitted to [name] due to lack of available ICU beds at [facility]. Per report, patient required 5mg  of Haldol en route due to agitation. History is limited due to the patient's mental status. Patient is currently drowsy, but will arouse to verbal and physical stimuli. Denies any pain.   PAST MEDICAL HISTORY :   has a past medical history of Panic attack; Hepatitis C (1995); Bipolar affective (HCC); Chronic pain; Heart murmur; Hernia, umbilical; Anxiety; and Cirrhosis (HCC).  has past surgical history that includes Breast surgery (2000) and Liver biopsy (2005). Prior to Admission medications   Medication Sig Start Date End Date Taking? Authorizing Provider  ALPRAZolam Prudy Feeler) 1 MG tablet Take 1 mg by  mouth 2 (two) times daily.    Historical Provider, MD  amphetamine-dextroamphetamine (ADDERALL) 30 MG tablet Take 30 mg by mouth daily.    Historical Provider, MD  cefUROXime (CEFTIN) 250 MG tablet Take 1 tablet (250 mg total) by mouth 2 (two) times daily with a meal. Patient not taking: Reported on 01/19/2015 01/13/15   Altamese Dilling, MD  ciprofloxacin (CIPRO) 500 MG tablet Take 1 tablet (500 mg total) by mouth 2 (two) times a week. Given for SBP prophylaxis. Patient not taking: Reported on 01/19/2015 01/13/15   Altamese Dilling, MD  clindamycin (CLEOCIN) 300 MG capsule Take 1 capsule (300 mg total) by mouth 3 (three) times daily. Patient not taking: Reported on 01/11/2015 12/02/14   Alford Highland, MD  methadone (DOLOPHINE) 10 MG tablet Take 20 mg by mouth 3 (three) times daily.    Historical Provider, MD  Multiple Vitamin (MULTIVITAMIN WITH MINERALS) TABS tablet Take 1 tablet by mouth daily with supper. Patient not taking: Reported on 01/19/2015 01/13/15   Altamese Dilling, MD  nadolol (CORGARD) 20 MG tablet Take 1 tablet (20 mg total) by mouth daily. 01/13/15   Altamese Dilling, MD  pantoprazole (PROTONIX) 40 MG tablet Take 1 tablet (40 mg total) by mouth daily. 01/13/15   Altamese Dilling, MD  spironolactone (ALDACTONE) 50 MG tablet Take 50 mg by mouth 2 (two) times daily.    Historical Provider, MD  sulfamethoxazole-trimethoprim (BACTRIM DS,SEPTRA DS) 800-160 MG tablet Take 1 tablet by mouth 2 (two) times a week.  01/14/15 04/08/15  Historical Provider, MD  triamcinolone cream (KENALOG) 0.1 % Apply 1 application topically 2 (two) times daily.  Historical Provider, MD   Allergies  Allergen Reactions  . Latex Itching    FAMILY HISTORY:  indicated that her mother is deceased. She indicated that her father is deceased.  SOCIAL HISTORY:  reports that she has been smoking.  She has never used smokeless tobacco. She reports that she drinks alcohol. She reports that she  does not use illicit drugs.  REVIEW OF SYSTEMS:  Unable to be obtained due to patient's mental status  SUBJECTIVE:   VITAL SIGNS: Temp:  [94.9 F (34.9 C)-97.8 F (36.6 C)] 97.5 F (36.4 C) (10/11 2300) Pulse Rate:  [57-72] 69 (10/11 2300) Resp:  [10-19] 14 (10/11 2300) BP: (82-107)/(44-62) 93/52 mmHg (10/11 2300) SpO2:  [96 %-100 %] 100 % (10/11 2300) Weight:  [133 lb 5 oz (60.47 kg)] 133 lb 5 oz (60.47 kg) (10/11 1551) HEMODYNAMICS:   VENTILATOR SETTINGS:   INTAKE / OUTPUT: No intake or output data in the 24 hours ending 01/19/15 2326  PHYSICAL EXAMINATION: General:  59yo chronically ill appearing female Neuro:  Lethargic, arouses to physical and verbal stimuli. Not oriented. Moves all extremities.  HEENT:  Dry appearing MM,  Cardiovascular:  S1s2, regular Lungs:  Normal work of breathing. No wheezes or crackles Abdomen:  Approximately 8-10cm protruding umbilical hernia 2-3cm in diameter with ulceration along the distal aspect. Purulent material noted along superior aspect of hernia. Approximately 20-25cm of erythema along abdominal wall adjacent to hernia. No frank ascites noted. Musculoskeletal:  1+ LE edema bilaterally with some weeping Skin:  Several small areas of skin break down and ecchymosis noted throughout  LABS:  CBC  Recent Labs Lab 01/13/15 0525 01/19/15 1505  WBC 8.3 6.8  HGB 7.6* 8.3*  HCT 23.3* 25.8*  PLT 73* 114*   Coag's  Recent Labs Lab 01/19/15 1505  INR 1.47   BMET  Recent Labs Lab 01/13/15 0525 01/19/15 1505  NA 134* 127*  K 3.5 4.5  CL 109 104  CO2 21* 19*  BUN 20 24*  CREATININE 0.92 1.20*  GLUCOSE 88 70   Electrolytes  Recent Labs Lab 01/13/15 0525 01/19/15 1505  CALCIUM 7.3* 7.7*   Sepsis Markers  Recent Labs Lab 01/19/15 1505 01/19/15 1837  LATICACIDVEN 1.3 0.8   ABG No results for input(s): PHART, PCO2ART, PO2ART in the last 168 hours. Liver Enzymes  Recent Labs Lab 01/13/15 0525 01/19/15 1505   AST 33 46*  ALT 20 21  ALKPHOS 69 85  BILITOT 2.5* 2.2*  ALBUMIN 1.5* 1.8*   Cardiac Enzymes No results for input(s): TROPONINI, PROBNP in the last 168 hours. Glucose No results for input(s): GLUCAP in the last 168 hours.  Imaging Ct Head Wo Contrast  01/19/2015   CLINICAL DATA:  Altered mental status, possible increased medication administration  EXAM: CT HEAD WITHOUT CONTRAST  TECHNIQUE: Contiguous axial images were obtained from the base of the skull through the vertex without intravenous contrast.  COMPARISON:  None.  FINDINGS: Bony calvarium is intact. Atrophic changes are noted. No findings to suggest acute hemorrhage, acute infarction or space-occupying mass lesion are noted.  IMPRESSION: Atrophic changes without acute abnormality.   Electronically Signed   By: Alcide Clever M.D.   On: 01/19/2015 15:26   Dg Chest Portable 1 View  01/19/2015   CLINICAL DATA:  59 year old with possible drug overdose with Xanax and/or methadone. Patient found with acute mental status changes and lethargy by the home health nurse earlier today.  EXAM: PORTABLE CHEST 1 VIEW  COMPARISON:  None.  FINDINGS:  Near expiratory image which accounts for mild atelectasis in the lung bases and accentuates the cardiac silhouette. Taking this into account, cardiac silhouette likely normal in size. Lungs otherwise clear. No localized airspace consolidation. No pleural effusions. No pneumothorax. Normal pulmonary vascularity.  IMPRESSION: Suboptimal inspiration accounts for mild bibasilar atelectasis. No acute cardiopulmonary disease otherwise.   Electronically Signed   By: Hulan Saas M.D.   On: 01/19/2015 15:16     ASSESSMENT / PLAN:  PULMONARY A: No acute issues P:   Monitor  CARDIOVASCULAR A: Hypotension - likely near baseline (discharged on 01/13/2015 with BP of 98/59), r/o sepsis P:  IVF Consider administration of albumin Cortisol Consider echo  RENAL A:   AKI Hyponatremia P:    IVF Monitor  GASTROINTESTINAL A:   Hep C cirrhosis R/o SBP Ulcerated umbilical hernia with abdominal wall cellulitis  H/o GI bleed - worked up in the past, thought to be slow bleed to be managed conservatively P:   Hold home nadolol, spironolactone Consider lactulose if mental status not improving Consider paracentesis Abx per ID section NPO until mental status improves Famotidine  Consider surgical consult in AM for ulcerated hernia  HEMATOLOGIC A:   Anemia, likely related to chronic liver disease and slow GI bleed P:  1U pRBC given Follow CBC Heparin for VTE prophylaxis  INFECTIOUS A:   Abdominal Wall Cellulitis secondary to ulcerated umbilical hernia R/o SBP P:   BCx2 10/11 >>> UC 10/11 >>>  Vanc 10/11 >>> Zosyn 10/11 >>>  Trend Lactic acid  ENDOCRINE A:   No acute issues P:   Cortisol level, add stress dose steroids if needed  NEUROLOGIC A:   Acute encephalopathy secondary to sepsis History of alcohol abuse P:   Hold home xanax Monitor for signs of withdrawal Consider ativan or precedex for agitation/withdrawal symptoms  FAMILY  - Updates: No family available  - Inter-disciplinary family meet or Palliative Care meeting due by:  01/26/2015  Katina Degree. Jimmey Ralph, MD [facility] Resident PGY-2 01/19/2015 11:26 PM

## 2015-01-19 NOTE — ED Notes (Signed)
Spoke with Magistrate from [place].  Mr. Stephanie Beck was there seeking advice for getting Healthcare Power of Attorney / Guadianship of patient.  Directed to [facility] tomorrow.  IVC paperwork on hold due to patient leaving [facility].

## 2015-01-19 NOTE — ED Notes (Signed)
bair hugger off.  Patient sleeping.

## 2015-01-19 NOTE — Progress Notes (Signed)
ANTIBIOTIC CONSULT NOTE - INITIAL  Pharmacy Consult for Vancomycin/Zosyn  Indication: Abdominal wall cellulitis/rule out SBP  Allergies  Allergen Reactions  . Latex Itching    Patient Measurements: ~60 kg  Vital Signs: Temp: 97.2 F (36.2 C) (10/11 2330) Temp Source: Core (Comment) (10/11 2300) BP: 87/40 mmHg (10/11 2330) Pulse Rate: 67 (10/11 2330)  Labs:  Recent Labs  01/19/15 1505  WBC 6.8  HGB 8.3*  PLT 114*  CREATININE 1.20*   Estimated Creatinine Clearance: 43.8 mL/min (by C-G formula based on Cr of 1.2).   Microbiology: Recent Results (from the past 720 hour(s))  Urine culture     Status: None   Collection Time: 01/12/15  7:47 PM  Result Value Ref Range Status   Specimen Description URINE, CLEAN CATCH  Final   Special Requests Normal  Final   Culture MULTIPLE SPECIES PRESENT, SUGGEST RECOLLECTION  Final   Report Status 01/14/2015 FINAL  Final    Medical History: Past Medical History  Diagnosis Date  . Panic attack   . Hepatitis C 1995  . Bipolar affective (HCC)   . Chronic pain     legs  . Heart murmur   . Hernia, umbilical     Protruding  . Anxiety   . Cirrhosis [facility])    Assessment: 59 y/o F tx from [facility] with altered mental status, starting broad spectrum anti-biotics for abdominal wall cellulitis/rule out SBP. Received vanco/zosyn x 1 in the ED at [facility] WBC WNL. CrCl ~45. Other labs/meds reviewed.   Goal of Therapy:  Vancomycin trough level 15-20 mcg/ml  Plan:  -Vancomycin 500 mg IV q12h -Zosyn 3.375G IV q8h to be infused over 4 hours -Trend WBC, temp, renal function  -Drug levels as indicated   Abran Duke 01/19/2015,11:39 PM

## 2015-01-19 NOTE — ED Notes (Addendum)
EMS called by home health nurse for AMS.  Home health nurse was in home to work with patient to place medications in daily pill boxes.  Patient has history of mis taking medications.  10 Xanax pills and several Methadone pills missing.  Patient's PCP is Dr. Arlana Pouch who requested patient be transferred to ED for evaluation.

## 2015-01-20 ENCOUNTER — Inpatient Hospital Stay (HOSPITAL_COMMUNITY): Payer: Medicare Other

## 2015-01-20 ENCOUNTER — Encounter (HOSPITAL_COMMUNITY): Payer: Self-pay | Admitting: *Deleted

## 2015-01-20 DIAGNOSIS — K7031 Alcoholic cirrhosis of liver with ascites: Secondary | ICD-10-CM

## 2015-01-20 DIAGNOSIS — F1010 Alcohol abuse, uncomplicated: Secondary | ICD-10-CM

## 2015-01-20 DIAGNOSIS — K7460 Unspecified cirrhosis of liver: Secondary | ICD-10-CM | POA: Insufficient documentation

## 2015-01-20 DIAGNOSIS — R4182 Altered mental status, unspecified: Secondary | ICD-10-CM | POA: Diagnosis present

## 2015-01-20 DIAGNOSIS — R188 Other ascites: Secondary | ICD-10-CM

## 2015-01-20 DIAGNOSIS — L8990 Pressure ulcer of unspecified site, unspecified stage: Secondary | ICD-10-CM | POA: Insufficient documentation

## 2015-01-20 DIAGNOSIS — L03311 Cellulitis of abdominal wall: Secondary | ICD-10-CM

## 2015-01-20 DIAGNOSIS — N179 Acute kidney failure, unspecified: Secondary | ICD-10-CM

## 2015-01-20 DIAGNOSIS — G9340 Encephalopathy, unspecified: Secondary | ICD-10-CM

## 2015-01-20 LAB — BASIC METABOLIC PANEL
Anion gap: 8 (ref 5–15)
BUN: 19 mg/dL (ref 6–20)
CALCIUM: 7.8 mg/dL — AB (ref 8.9–10.3)
CO2: 21 mmol/L — ABNORMAL LOW (ref 22–32)
CREATININE: 1.08 mg/dL — AB (ref 0.44–1.00)
Chloride: 104 mmol/L (ref 101–111)
GFR calc Af Amer: >60 mL/min (ref >60)
GFR, EST NON AFRICAN AMERICAN: 55 mL/min — AB (ref >60)
GLUCOSE: 76 mg/dL (ref 65–99)
Potassium: 4 mmol/L (ref 3.5–5.1)
Sodium: 133 mmol/L — ABNORMAL LOW (ref 135–145)

## 2015-01-20 LAB — CBC
HCT: 26.6 % — ABNORMAL LOW (ref 36.0–46.0)
Hemoglobin: 8.5 g/dL — ABNORMAL LOW (ref 12.0–15.0)
MCH: 26.6 pg (ref 26.0–34.0)
MCHC: 32 g/dL (ref 30.0–36.0)
MCV: 83.4 fL (ref 78.0–100.0)
PLATELETS: 105 10*3/uL — AB (ref 150–400)
RBC: 3.19 MIL/uL — ABNORMAL LOW (ref 3.87–5.11)
RDW: 19.8 % — AB (ref 11.5–15.5)
WBC: 4.7 10*3/uL (ref 4.0–10.5)

## 2015-01-20 LAB — CORTISOL: CORTISOL PLASMA: 8.9 ug/dL

## 2015-01-20 LAB — GLUCOSE, CAPILLARY
GLUCOSE-CAPILLARY: 79 mg/dL (ref 65–99)
GLUCOSE-CAPILLARY: 81 mg/dL (ref 65–99)
Glucose-Capillary: 126 mg/dL — ABNORMAL HIGH (ref 65–99)
Glucose-Capillary: 57 mg/dL — ABNORMAL LOW (ref 65–99)
Glucose-Capillary: 62 mg/dL — ABNORMAL LOW (ref 65–99)
Glucose-Capillary: 64 mg/dL — ABNORMAL LOW (ref 65–99)

## 2015-01-20 LAB — TYPE AND SCREEN
ABO/RH(D): AB NEG
ANTIBODY SCREEN: NEGATIVE
UNIT DIVISION: 0
Unit division: 0

## 2015-01-20 LAB — LACTIC ACID, PLASMA
LACTIC ACID, VENOUS: 0.8 mmol/L (ref 0.5–2.0)
Lactic Acid, Venous: 1 mmol/L (ref 0.5–2.0)

## 2015-01-20 LAB — MAGNESIUM: Magnesium: 1.9 mg/dL (ref 1.7–2.4)

## 2015-01-20 LAB — PHOSPHORUS: Phosphorus: 4 mg/dL (ref 2.5–4.6)

## 2015-01-20 LAB — LIPASE, BLOOD: Lipase: 44 U/L (ref 22–51)

## 2015-01-20 LAB — TSH: TSH: 11.031 u[IU]/mL — ABNORMAL HIGH (ref 0.350–4.500)

## 2015-01-20 LAB — MRSA PCR SCREENING: MRSA BY PCR: NEGATIVE

## 2015-01-20 LAB — AMYLASE: Amylase: 49 U/L (ref 28–100)

## 2015-01-20 LAB — PROCALCITONIN

## 2015-01-20 MED ORDER — DEXTROSE 50 % IV SOLN
INTRAVENOUS | Status: AC
  Administered 2015-01-20: 50 mL
  Filled 2015-01-20: qty 50

## 2015-01-20 MED ORDER — CHLORHEXIDINE GLUCONATE 0.12 % MT SOLN
15.0000 mL | Freq: Two times a day (BID) | OROMUCOSAL | Status: DC
  Administered 2015-01-20 – 2015-01-21 (×3): 15 mL via OROMUCOSAL
  Filled 2015-01-20 (×2): qty 15

## 2015-01-20 MED ORDER — IOHEXOL 300 MG/ML  SOLN
100.0000 mL | Freq: Once | INTRAMUSCULAR | Status: AC | PRN
  Administered 2015-01-20: 100 mL via INTRAVENOUS

## 2015-01-20 MED ORDER — ANTISEPTIC ORAL RINSE SOLUTION (CORINZ)
7.0000 mL | Freq: Four times a day (QID) | OROMUCOSAL | Status: DC
  Administered 2015-01-20 – 2015-01-22 (×6): 7 mL via OROMUCOSAL

## 2015-01-20 MED ORDER — SODIUM CHLORIDE 0.9 % IV SOLN
INTRAVENOUS | Status: DC
  Administered 2015-01-20: via INTRAVENOUS

## 2015-01-20 MED ORDER — LACTULOSE 10 GM/15ML PO SOLN: 30.0000 g | Freq: Three times a day (TID) | ORAL | Status: DC

## 2015-01-20 MED ORDER — DEXTROSE 50 % IV SOLN
25.0000 mL | Freq: Once | INTRAVENOUS | Status: AC
  Administered 2015-01-20: 25 mL via INTRAVENOUS

## 2015-01-20 MED ORDER — DEXTROSE-NACL 5-0.45 % IV SOLN
INTRAVENOUS | Status: DC
  Administered 2015-01-20 – 2015-01-22 (×4): via INTRAVENOUS

## 2015-01-20 MED ORDER — DEXTROSE 50 % IV SOLN
INTRAVENOUS | Status: AC
  Filled 2015-01-20: qty 50

## 2015-01-20 NOTE — Progress Notes (Signed)
Patient arrived on unit via [facility]. PRBCs running. Elink button used to notify MD patient had arrived. NP is being sent to bedside.   2330 transfusion ended. Vitals charted. No reaction noted.

## 2015-01-20 NOTE — Consult Note (Addendum)
[facility] wound consult [facility]. Reason for Consult:Umbilical hernia with protruding red moist omentum coming out at least 4 cm from the opening of the umbilical hernia.  Wound type:Chronic incarcerated umbilical hernia. Erythema and tenderness surrounding hernia and umbilicus.  Dressing procedure/placement/frequency:Keep hernia moist and covered with vaseline gauze. Secure with ABD pad and tape. Surgical team has consulted during a previous admission and pt is not a candidate for further interventions, according to the EMR. Coccyx with stage 2 pressure injury; .5X1X.1cm, pink and moist, no odor or drainage. Bilat knees with deep tissue injuries; left .8X.8cm and right .5X.5cm.  Both are dark purple-reddish without odor or drainage Several areas of partial thickness skin tears; right arm 3X4X.1cm, 60% of skin approximated over wound, 40% dark red with small amt yellow drainage, no odor.  Right arm with red partial thickness abrasion 1.5X1.5X.1cm, no odor or drainage Left elbow with multiple partial thickness abrasions; affected area 3.5X4cm with several scabbed lesions, other are open, shallow and dark red and dry without odor or drainage. Plan: Foam dressings to protect coccyx and other areas of abrasions. Vaseline gauze to abd to protect from further injury. This will not promote healing or resolve the issue of protruding omentum. Please reconsult surgical team for further plan of care if this becomes more pronounced  Please re-consult if further assistance is needed.  Thank-you,  Cammie Mcgeeawn Rillie Riffel MSN, RN, CWOCN, [place], CNS [phone]

## 2015-01-20 NOTE — Progress Notes (Signed)
[facility]  Patient Status: Active (receiving services up to time of hospitalization)  [facility] is providing the following services: RN and PT  If patient discharges after hours, please call [phone].   Avie EchevariaKaren Nussbaum 01/20/2015, 12:04 PM

## 2015-01-20 NOTE — Progress Notes (Signed)
Report called and given to ParisMelinda, RN on 62M. Patient transferred from CT to room 62M15

## 2015-01-20 NOTE — Progress Notes (Signed)
PULMONARY / CRITICAL CARE MEDICINE   Name: Stephanie ISHII MRN: [id number] DOB: November 12, 1955    ADMISSION DATE:  01/19/2015 CONSULTATION DATE:  01/19/2015  [facility]ERRING MD :  [facility]  CHIEF COMPLAINT:  Altered Mental Status  INITIAL PRESENTATION: 59yo female with history of hep C cirrhosis, chronic ulcerated umbilical hernia, alcohol abuse, and anxiety who presented to [facility] ED with altered mental status and hypothermia likely secondary to sepsis due to cellulitis of her abdominal wall and ulcerated hernia.   STUDIES:  Head CT 01/19/2015 - No acute abnormalities CXR 01/19/2015 - Mild bibasilar atelectasis Abdominal US 01/20/2015 - PENDING CT Abd/Pelvis w/ IV 01/20/15 - PENDING  SIGNIFICANT EVENTS: 01/19/2015 - Admit  SUBJECTIVE: Patient remains altered but is able to tell me that she is cold. She dozes off quickly.  REVIEW OF SYSTEMS:  Unable to be obtained due to patient's mental status  VITAL SIGNS: Temp:  [94.9 F (34.9 C)-97.8 F (36.6 C)] 96.6 F (35.9 C) (10/12 0800) Pulse Rate:  [56-73] 60 (10/12 0800) Resp:  [10-19] 12 (10/12 0800) BP: (82-107)/(39-62) 98/58 mmHg (10/12 0800) SpO2:  [96 %-100 %] 99 % (10/12 0800) Weight:  [126 lb 15.8 oz (57.6 kg)-133 lb 5 oz (60.47 kg)] 126 lb 15.8 oz (57.6 kg) (10/12 0441) HEMODYNAMICS:   VENTILATOR SETTINGS:   INTAKE / OUTPUT:  Intake/Output Summary (Last 24 hours) at 01/20/15 0902 Last data filed at 01/20/15 0800  Gross per 24 hour  Intake 681.25 ml  Output   1100 ml  Net -418.75 ml    PHYSICAL EXAMINATION: General:  Laying in bed. Sitter at bedside. No acute distress.  Integument:  Warm & dry. No rash on exposed skin. Superficial breakdown no protruding umbilical hernia. HEENT:  Dry mucus membranes. No oral ulcers. No scleral injection.  Cardiovascular:  Regular rate. No edema. No appreciable JVD.  Pulmonary:  Good aeration & clear to auscultation bilaterally. Symmetric chest wall rise. No  accessory muscle use on room air. Abdomen: Soft. Normal bowel sounds. Nondistended. Protruding umbilical hernia. Neurological: Following commands. Patient does not answer questions appropriately consistently. Appears grossly nonfocal and moving all 4 extremities equally.  LABS:  CBC  Recent Labs Lab 01/19/15 1505 01/20/15 0235  WBC 6.8 4.7  HGB 8.3* 8.5*  HCT 25.8* 26.6*  PLT 114* 105*   Coag's  Recent Labs Lab 01/19/15 1505  INR 1.47   BMET  Recent Labs Lab 01/19/15 1505 01/20/15 0235  NA 127* 133*  K 4.5 4.0  CL 104 104  CO2 19* 21*  BUN 24* 19  CREATININE 1.20* 1.08*  GLUCOSE 70 76   Electrolytes  Recent Labs Lab 01/19/15 1505 01/20/15 0235  CALCIUM 7.7* 7.8*  MG  --  1.9  PHOS  --  4.0   Sepsis Markers  Recent Labs Lab 01/19/15 1837 01/20/15 0235 01/20/15 0730  LATICACIDVEN 0.8 1.0 0.8  PROCALCITON  --  <0.10  --    ABG No results for input(s): PHART, PCO2ART, PO2ART in the last 168 hours. Liver Enzymes  Recent Labs Lab 01/19/15 1505  AST 46*  ALT 21  ALKPHOS 85  BILITOT 2.2*  ALBUMIN 1.8*   Cardiac Enzymes No results for input(s): TROPONINI, PROBNP in the last 168 hours. Glucose  Recent Labs Lab 01/20/15 0008 01/20/15 0204  GLUCAP 64* 79    Imaging Ct Head Wo Contrast  01/19/2015  CLINICAL DATA:  Altered mental status, possible increased medication administration EXAM: CT HEAD WITHOUT CONTRAST TECHNIQUE: Contiguous axial images were  obtained from the base of the skull through the vertex without intravenous contrast. COMPARISON:  None. FINDINGS: Bony calvarium is intact. Atrophic changes are noted. No findings to suggest acute hemorrhage, acute infarction or space-occupying mass lesion are noted. IMPRESSION: Atrophic changes without acute abnormality. Electronically Signed   By: Alcide CleverMark  Lukens M.D.   On: 01/19/2015 15:26   Dg Chest Portable 1 View  01/19/2015  CLINICAL DATA:  59 year old with possible drug overdose with Xanax  and/or methadone. Patient found with acute mental status changes and lethargy by the home health nurse earlier today. EXAM: PORTABLE CHEST 1 VIEW COMPARISON:  None. FINDINGS: Near expiratory image which accounts for mild atelectasis in the lung bases and accentuates the cardiac silhouette. Taking this into account, cardiac silhouette likely normal in size. Lungs otherwise clear. No localized airspace consolidation. No pleural effusions. No pneumothorax. Normal pulmonary vascularity. IMPRESSION: Suboptimal inspiration accounts for mild bibasilar atelectasis. No acute cardiopulmonary disease otherwise. Electronically Signed   By: Hulan Saashomas  Lawrence M.D.   On: 01/19/2015 15:16     ASSESSMENT / PLAN:  PULMONARY A:  No acute issues  P:   Monitor pulse ox continuously Potential risk of aspiration  CARDIOVASCULAR A:  Hypotension - likely near baseline (discharged on 01/13/2015 with BP of 98/59), r/o sepsis  P:  Monitor on telemetry Vitals per unit protocol  RENAL A:   Acute Renal Failure - improving Hyponatremia - mild  P:   Monitor UOP Monitor renal function with daily BUN/Creatinine  GASTROINTESTINAL A:   Hep C cirrhosis R/o SBP Ulcerated umbilical hernia with abdominal wall cellulitis  H/o GI bleed - worked up in the past, thought to be slow bleed to be managed conservatively  P:   Hold home nadolol, spironolactone Consider lactulose enema if mental status doesn't improve Consider paracentesis depending on imaging Abx per ID section NPO until mental status improves Famotidine IV q12hr Abdominal US CT abdomen/Pelvis with IV contrast only  HEMATOLOGIC A:   Anemia - likely related to chronic liver disease and slow GI bleed  P:  S/P 1u PRBC Follow CBC daily Heparin [place] q8hr SCDs  INFECTIOUS A:   Abdominal Wall Cellulitis - secondary to ulcerated umbilical hernia R/o SBP  P:   Trending Lactic Acid Checking Abdominal US & CT of abdomen/pelvis  BCx2 10/11 >>> UC  10/11 >>>  Vanc 10/11 >>> Zosyn 10/11 >>>  ENDOCRINE A:   No acute issues  P:   Accuchecks q4hr with instructions to notify MD  NEUROLOGIC A:   Acute encephalopathy - secondary to sepsis vs medication induced History of alcohol abuse  P:   Hold home xanax Monitor for signs of withdrawal Consider ativan or precedex for agitation/withdrawal symptoms Holding Lactulose  FAMILY  - Updates: No family available  - Inter-disciplinary family meet or Palliative Care meeting due by:  01/26/2015  TODAY'S SUMMARY:  59 year old female admitted with altered mental status and known underlying cirrhosis. Mental status is still poor. Quite possibly secondary to medications rather than cirrhosis. Holding on oral lactulose at this time given continued altered mentation and potential for aspiration. Checking CT scan of the abdomen/pelvis with IV contrast given the patient's umbilical hernia.  Donna ChristenJennings E. Jamison NeighborNestor, M.D. [facility] Pager:  [phone] After 3pm or if no response, call [phone] 01/20/2015 9:02 AM

## 2015-01-21 ENCOUNTER — Ambulatory Visit: Payer: Self-pay | Admitting: Gastroenterology

## 2015-01-21 DIAGNOSIS — E43 Unspecified severe protein-calorie malnutrition: Secondary | ICD-10-CM

## 2015-01-21 LAB — COMPREHENSIVE METABOLIC PANEL
ALT: 18 U/L (ref 14–54)
ANION GAP: 5 (ref 5–15)
AST: 33 U/L (ref 15–41)
Albumin: 1.3 g/dL — ABNORMAL LOW (ref 3.5–5.0)
Alkaline Phosphatase: 73 U/L (ref 38–126)
BUN: 13 mg/dL (ref 6–20)
CALCIUM: 7.5 mg/dL — AB (ref 8.9–10.3)
CHLORIDE: 107 mmol/L (ref 101–111)
CO2: 22 mmol/L (ref 22–32)
CREATININE: 0.96 mg/dL (ref 0.44–1.00)
GFR calc Af Amer: >60 mL/min (ref >60)
GFR calc non Af Amer: >60 mL/min (ref >60)
GLUCOSE: 101 mg/dL — AB (ref 65–99)
Potassium: 3.8 mmol/L (ref 3.5–5.1)
Sodium: 134 mmol/L — ABNORMAL LOW (ref 135–145)
Total Bilirubin: 1.8 mg/dL — ABNORMAL HIGH (ref 0.3–1.2)
Total Protein: 4.8 g/dL — ABNORMAL LOW (ref 6.5–8.1)

## 2015-01-21 LAB — CBC WITH DIFFERENTIAL/PLATELET
BASOS ABS: 0 10*3/uL (ref 0.0–0.1)
Basophils Relative: 0 %
EOS PCT: 6 %
Eosinophils Absolute: 0.3 10*3/uL (ref 0.0–0.7)
HEMATOCRIT: 26.7 % — AB (ref 36.0–46.0)
HEMOGLOBIN: 8.3 g/dL — AB (ref 12.0–15.0)
LYMPHS ABS: 1.7 10*3/uL (ref 0.7–4.0)
LYMPHS PCT: 37 %
MCH: 26.2 pg (ref 26.0–34.0)
MCHC: 31.1 g/dL (ref 30.0–36.0)
MCV: 84.2 fL (ref 78.0–100.0)
Monocytes Absolute: 0.8 10*3/uL (ref 0.1–1.0)
Monocytes Relative: 17 %
NEUTROS ABS: 1.8 10*3/uL (ref 1.7–7.7)
Neutrophils Relative %: 39 %
PLATELETS: 99 10*3/uL — AB (ref 150–400)
RBC: 3.17 MIL/uL — AB (ref 3.87–5.11)
RDW: 20.3 % — ABNORMAL HIGH (ref 11.5–15.5)
WBC: 4.5 10*3/uL (ref 4.0–10.5)

## 2015-01-21 LAB — GLUCOSE, CAPILLARY
GLUCOSE-CAPILLARY: 83 mg/dL (ref 65–99)
GLUCOSE-CAPILLARY: 104 mg/dL — AB (ref 65–99)
GLUCOSE-CAPILLARY: 98 mg/dL (ref 65–99)
GLUCOSE-CAPILLARY: 128 mg/dL — AB (ref 65–99)
Glucose-Capillary: 156 mg/dL — ABNORMAL HIGH (ref 65–99)

## 2015-01-21 LAB — BODY FLUID CELL COUNT WITH DIFFERENTIAL
EOS FL: 1 %
LYMPHS FL: 36 %
MONOCYTE-MACROPHAGE-SEROUS FLUID: 30 % — AB (ref 50–90)
Neutrophil Count, Fluid: 33 % — ABNORMAL HIGH (ref 0–25)
WBC FLUID: 31 uL (ref 0–1000)

## 2015-01-21 LAB — ALBUMIN, FLUID (OTHER)

## 2015-01-21 LAB — LACTATE DEHYDROGENASE, PLEURAL OR PERITONEAL FLUID: LD, Fluid: 28 U/L — ABNORMAL HIGH (ref 3–23)

## 2015-01-21 LAB — PROTIME-INR
INR: 1.69 — ABNORMAL HIGH (ref 0.00–1.49)
Prothrombin Time: 19.9 seconds — ABNORMAL HIGH (ref 11.6–15.2)

## 2015-01-21 LAB — APTT: APTT: 33 s (ref 24–37)

## 2015-01-21 LAB — MAGNESIUM: Magnesium: 1.8 mg/dL (ref 1.7–2.4)

## 2015-01-21 LAB — PROTEIN, BODY FLUID

## 2015-01-21 LAB — LACTATE DEHYDROGENASE: LDH: 239 U/L — ABNORMAL HIGH (ref 98–192)

## 2015-01-21 LAB — PHOSPHORUS: PHOSPHORUS: 3.4 mg/dL (ref 2.5–4.6)

## 2015-01-21 MED ORDER — ALBUMIN HUMAN 5 % IV SOLN
12.5000 g | Freq: Four times a day (QID) | INTRAVENOUS | Status: AC
  Administered 2015-01-21 (×2): 12.5 g via INTRAVENOUS
  Filled 2015-01-21 (×2): qty 250

## 2015-01-21 MED ORDER — FAMOTIDINE 20 MG PO TABS
20.0000 mg | ORAL_TABLET | Freq: Two times a day (BID) | ORAL | Status: DC
  Administered 2015-01-21 – 2015-01-22 (×2): 20 mg via ORAL
  Filled 2015-01-21 (×2): qty 1

## 2015-01-21 NOTE — Care Management Note (Signed)
Case Management Note  Patient Details  Name: Stephanie Beck MRN: [id number] Date of Birth: 08/30/1955  Subjective/Objective:     Lives at home with friend.  States able to do her ADL;s.  Has [facility] for PT.  Denies problems getting her meds and paying for them.  Does not drive, friend drives her.  Does not have any DME at home.  At this time denies need for any further assistance.  Will need orders prior to discharge for resumption of HH.                Action/Plan:   Expected Discharge Date:                  Expected Discharge Plan:  Home/Self Care  In-House Referral:     Discharge planning Services     Post Acute Care Choice:  Resumption of Svcs/PTA Provider Choice offered to:     DME Arranged:    DME Agency:  [facility]  HH Arr[facility]  [facility]     Status of Service:  In process, will continue to follow  Medicare Important Message Given:    Date Medicare IM Given:    Medicare IM give by:    Date Additional Medicare IM Given:    Additional Medicare Important Message give by:     If discussed at Long Length of Stay Meetings, dates discussed:    Additional Comments:  Stephanie Beck, Stephanie Turpen Jane, RN 01/21/2015, 1:56 PM

## 2015-01-21 NOTE — Progress Notes (Signed)
PULMONARY / CRITICAL CARE MEDICINE   Name: Stephanie Beck MRN: [id number] DOB: [birth year]    ADMISSION DATE:  01/19/2015 CONSULTATION DATE:  01/19/2015  [facility] :  [facility]  CHIEF COMPLAINT:  Altered Mental Status  INITIAL PRESENTATION: 59yo female with history of hep C cirrhosis, chronic ulcerated umbilical hernia, alcohol abuse, and anxiety who presented to [facility] ED with altered mental status and hypothermia likely secondary to sepsis due to cellulitis of her abdominal wall and ulcerated hernia.   STUDIES:  Head CT 01/19/2015 - No acute abnormalities CXR 01/19/2015 - Mild bibasilar atelectasis Abdominal US 01/20/2015 - Chronic cirrhosis and ascites, multiple gallstones CT Abd/Pelvis w/ IV 01/20/15 - thrombosed portal vein, cirrhosis, liver nodules, large volume ascites  SIGNIFICANT EVENTS: 01/19/2015 - Admit 01/21/2015 - Mental status greatly improved, paracentesis performed  SUBJECTIVE:  Patient awake this morning. Oriented to person, place, and time. Asking for food.  VITAL SIGNS: Temp:  [96.6 F (35.9 C)-98.8 F (37.1 C)] 98.4 F (36.9 C) (10/13 0300) Pulse Rate:  [51-66] 51 (10/13 0700) Resp:  [8-18] 15 (10/13 0700) BP: (84-112)/(40-71) 97/40 mmHg (10/13 0700) SpO2:  [98 %-100 %] 99 % (10/13 0700) Weight:  [130 lb 15.3 oz (59.4 kg)] 130 lb 15.3 oz (59.4 kg) (10/13 0318) HEMODYNAMICS:   VENTILATOR SETTINGS:   INTAKE / OUTPUT:  Intake/Output Summary (Last 24 hours) at 01/21/15 0737 Last data filed at 01/21/15 0600  Gross per 24 hour  Intake   2025 ml  Output    837 ml  Net   1188 ml    PHYSICAL EXAMINATION: General:  Laying in bed. Sitter at bedside. No acute distress.  Integument:  Warm & dry. No rash on exposed skin. Superficial breakdown on protruding umbilical hernia. HEENT:  Dry mucus membranes. No oral ulcers. No scleral injection.  Cardiovascular:  Regular rate. No edema. No appreciable JVD.  Pulmonary:  Good aeration  & clear to auscultation bilaterally. Symmetric chest wall rise. No accessory muscle use on room air. Abdomen: Soft. Normal bowel sounds. Nondistended.  Oriented x4. Moves all extremities spontaneously.   LABS:  CBC  Recent Labs Lab 01/19/15 1505 01/20/15 0235 01/21/15 0215  WBC 6.8 4.7 4.5  HGB 8.3* 8.5* 8.3*  HCT 25.8* 26.6* 26.7*  PLT 114* 105* 99*   Coag's  Recent Labs Lab 01/19/15 1505 01/21/15 0215  APTT  --  33  INR 1.47 1.69*   BMET  Recent Labs Lab 01/19/15 1505 01/20/15 0235 01/21/15 0215  NA 127* 133* 134*  K 4.5 4.0 3.8  CL 104 104 107  CO2 19* 21* 22  BUN 24* 19 13  CREATININE 1.20* 1.08* 0.96  GLUCOSE 70 76 101*   Electrolytes  Recent Labs Lab 01/19/15 1505 01/20/15 0235 01/21/15 0215  CALCIUM 7.7* 7.8* 7.5*  MG  --  1.9 1.8  PHOS  --  4.0 3.4   Sepsis Markers  Recent Labs Lab 01/19/15 1837 01/20/15 0235 01/20/15 0730  LATICACIDVEN 0.8 1.0 0.8  PROCALCITON  --  <0.10  --    ABG No results for input(s): PHART, PCO2ART, PO2ART in the last 168 hours. Liver Enzymes  Recent Labs Lab 01/19/15 1505 01/21/15 0215  AST 46* 33  ALT 21 18  ALKPHOS 85 73  BILITOT 2.2* 1.8*  ALBUMIN 1.8* 1.3*   Cardiac Enzymes No results for input(s): TROPONINI, PROBNP in the last 168 hours. Glucose  Recent Labs Lab 01/20/15 1118 01/20/15 1319 01/20/15 1349 01/20/15 1904 01/20/15 2320 01/21/15 0303  GLUCAP 62* 57* 126* 81 83 104*    Imaging [place] Abdomen Complete  01/20/2015  CLINICAL DATA:  Cirrhosis EXAM: ULTRASOUND ABDOMEN COMPLETE COMPARISON:  11/30/2014 FINDINGS: Gallbladder: Multiple gallstones are identified. No significant gallbladder wall thickening is noted despite the degree of ascites. Common bile duct: Diameter: 6.5 mm. This is within normal limits for the patient's given age. Liver: Nodular appearing consistent with the underlying given clinical history. Multiple hypoechoic lesions are noted scattered throughout the liver which  may represent regenerating nodules. Largest of these measures 1.8 cm in the right lobe of the liver. Portal vein demonstrates minimal to no flow and is likely occluded IVC: No abnormality visualized. Pancreas: Visualized portion unremarkable. Spleen: Size and appearance within normal limits. Right Kidney: Length: 13.2 cm. Echogenicity within normal limits. No mass or hydronephrosis visualized. Left Kidney: Length: 10.3 cm. Echogenicity within normal limits. No mass or hydronephrosis visualized. Abdominal aorta: No aneurysm visualized. Other findings: Diffuse ascites is identified. IMPRESSION: Changes of cirrhosis and ascites. 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 Multiple gallstones without complicating factors. Electronically Signed   By: Alcide CleverMark  Lukens M.D.   On: 01/20/2015 12:33   Ct Abdomen Pelvis W Contrast  01/20/2015  CLINICAL DATA:  Evaluate umbilical hernia. EXAM: CT ABDOMEN AND PELVIS WITH CONTRAST TECHNIQUE: Multidetector CT imaging of the abdomen and pelvis was performed using the standard protocol following bolus administration of intravenous contrast. CONTRAST:  100mL OMNIPAQUE IOHEXOL 300 MG/ML  SOLN COMPARISON:  CT abdomen pelvis 10/14/2012 FINDINGS: Lower chest: Normal heart size. Small bilateral pleural effusions. Dependent atelectasis within the bilateral lower lobes. Hepatobiliary: Liver is markedly cirrhotic in morphology and nodular in appearance. There is a more focal 1 cm exophytic nodule off the left hepatic lobe (image 26; series 201). Additionally adjacent to this is a perfusion anomaly. Within the right hepatic lobe (image 6; series 31) there is suggestion of a 1.7 cm low-attenuation nodule. Possible 1.6 cm low-attenuation nodule within the hepatic dome (image 1; series 301). Multiple stones within the gallbladder lumen. No gallbladder wall thickening. Pancreas: Unremarkable Spleen: Unremarkable Adrenals/Urinary Tract: The adrenal glands are  normal. Kidneys enhance symmetrically with contrast. Foley catheter terminates within a decompressed urinary bladder. Stomach/Bowel: No abnormal bowel wall thickening or evidence for bowel obstruction. Vascular/Lymphatic: Normal caliber abdominal aorta. The splenic vein is patent. The superior mesenteric vein is patent to the level of confluence. Portal vein is not opacified and is likely thrombosed. Multiple collateral vessels are demonstrated emanating off of the superior mesenteric and splenic veins. 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 Multiple prominent retroperitoneal and upper abdominal mesenteric lymph nodes are demonstrated including a 1.7 cm gastrohepatic lymph node (image 28; series 201) and a 1.5 cm aortocaval lymph node (image 41; series 201). Other: Large volume ascites throughout the abdomen. Uterus and adnexal structures are unremarkable. Interval development of periumbilical hernia containing omental fat and multiple collateral vessels. No evidence for herniated bowel. Musculoskeletal: Diffuse anasarca. Lumbar spine degenerative changes. No aggressive or acute appearing osseous lesions. IMPRESSION: Interval development of a periumbilical hernia containing omental fat and multiple collateral vessels. The portal vein is not opacified with contrast and likely thrombosed. There are multiple collateral vessels including upper abdominal and paraesophageal varices. Cirrhotic morphology to the liver. This has progressed when compared to prior exam. Additionally there are multiple low-attenuation nodules on the delayed phase images raising possibility of HCC. Recommend dedicated evaluation of the liver with pre and post contrast-enhanced MRI. Multiple stones within the gallbladder lumen. Large  volume ascites.  Diffuse anasarca. These results will be called to the ordering clinician or representative by the Radiologist Assistant, and communication documented in the PACS or zVision Dashboard.  Electronically Signed   By: Annia Belt M.D.   On: 01/20/2015 13:24     ASSESSMENT / PLAN:  PULMONARY A:  No acute issues P:   Monitor pulse ox continuously Potential risk of aspiration  CARDIOVASCULAR A:  Hypotension - likely near baseline (discharged on 01/13/2015 with BP of 98/59), r/o sepsis P:  Monitor on telemetry Vitals per unit protocol  RENAL A:   Acute Renal Failure - improving Hyponatremia - mild P:   Monitor UOP Monitor renal function with daily BUN/Creatinine  GASTROINTESTINAL A:   Hep C cirrhosis, thrombosed portal vein, several liver nodules on CT, ?HCC R/o SBP Ulcerated umbilical hernia with abdominal wall cellulitis  H/o GI bleed - worked up in the past, thought to be slow bleed to be managed conservatively  P:   Hold home nadolol, spironolactone Paracentesis today, will give replacement albumin Abx per ID section Famotidine IV q12hr Check AFP level Will need abdominal MRI to further characterize liver nodules  HEMATOLOGIC A:   Anemia - likely related to chronic liver disease and slow GI bleed  P:  S/P 1u PRBC Follow CBC daily Heparin [place] q8hr SCDs  INFECTIOUS A:   Abdominal Wall Cellulitis - secondary to ulcerated umbilical hernia R/o SBP  P:   Lactic acid reassuring  Peritoneal culture >>> BCx2 10/11 >>> UC 10/11 >>>  Vanc 10/11 >>>10/13 Zosyn 10/11 >>>  ENDOCRINE A:   No acute issues  P:   Accuchecks q4hr with instructions to notify MD  NEUROLOGIC A:   Acute encephalopathy - secondary to sepsis vs medication induced History of alcohol abuse  P:   Hold home xanax Monitor for signs of withdrawal Consider ativan or precedex for agitation/withdrawal symptoms Holding Lactulose  FAMILY  - Updates: No family available  - Inter-disciplinary family meet or Palliative Care meeting due by:  01/26/2015  Today's Summary: Mental status greatly improved. Unclear etiology - may be sepsis, but no fever or leukocytosis with  normal procalcitonin level, possibly hepatic encephalopathy, though patient cleared without lactulose. Also possibly related to overdose of home meds - is on methadone and xanax at home. Paracentesis today to check for SBP, will continue zosyn until SBP is ruled out. Will check AFP level due to liver nodules concerning for HCC. Stable for transfer out of ICU today.   Katina Degree. Jimmey Ralph, MD [facility] Resident PGY-2 01/21/2015 7:37 AM

## 2015-01-21 NOTE — Progress Notes (Signed)
Transfer note:  Arrival Method: Pt arrived in bed from 46M Mental Orientation: A&OX3 Telemetry: 6E18  Assessment: See flowsheet Skin: See flowsheet IV: L wrist; L forearn Pain: Denies Tubes: Foley Cath Safety Measures: Bed in lowest position, call light with in reach, safety sitter at bedside.   6700 Orientation: Patient has been oriented to the unit, staff and to the room.  Orders have been reviewed and implemented. Will continue to monitor pt.   Lujean AmelKadeesha Latavia Goga,RN

## 2015-01-21 NOTE — Progress Notes (Signed)
ANTIBIOTIC CONSULT NOTE - INITIAL  Pharmacy Consult for Zosyn  Indication: rule out sepsis  Allergies  Allergen Reactions  . Latex Itching    Patient Measurements: Height:  (157.5 cm) Weight: 130 lb 15.3 oz (59.4 kg) IBW/kg (Calculated) : 50.1  Vital Signs: Temp: 98 F (36.7 C) (10/13 1211) Temp Source: Oral (10/13 1211) BP: 93/49 mmHg (10/13 1200) Pulse Rate: 66 (10/13 1200) Intake/Output from previous day: 10/12 0701 - 10/13 0700 In: 1925 [I.V.:1500; IV Piggyback:425] Out: 837 [Urine:837] Intake/Output from this shift: Total I/O In: 1175 [P.O.:550; I.V.:300; IV Piggyback:325] Out: 150 [Urine:150]  Labs:  Recent Labs  01/19/15 1505 01/20/15 0235 01/21/15 0215  WBC 6.8 4.7 4.5  HGB 8.3* 8.5* 8.3*  PLT 114* 105* 99*  CREATININE 1.20* 1.08* 0.96   Estimated Creatinine Clearance: 50.5 mL/min (by C-G formula based on Cr of 0.96). No results for input(s): VANCOTROUGH, VANCOPEAK, VANCORANDOM, GENTTROUGH, GENTPEAK, GENTRANDOM, TOBRATROUGH, TOBRAPEAK, TOBRARND, AMIKACINPEAK, AMIKACINTROU, AMIKACIN in the last 72 hours.   Microbiology: Recent Results (from the past 720 hour(s))  Urine culture     Status: None   Collection Time: 01/12/15  7:47 PM  Result Value Ref Range Status   Specimen Description URINE, CLEAN CATCH  Final   Special Requests Normal  Final   Culture MULTIPLE SPECIES PRESENT, SUGGEST RECOLLECTION  Final   Report Status 01/14/2015 FINAL  Final  Culture, blood (routine x 2)     Status: None (Preliminary result)   Collection Time: 01/19/15  3:00 PM  Result Value Ref Range Status   Specimen Description BLOOD LEFT ARM  Final   Special Requests BAA, 5CC  Final   Culture NO GROWTH 2 DAYS  Final   Report Status PENDING  Incomplete  Culture, blood (routine x 2)     Status: None (Preliminary result)   Collection Time: 01/19/15  3:05 PM  Result Value Ref Range Status   Specimen Description BLOOD LEFT ARM  Final   Special Requests BOTTLES DRAWN AEROBIC  AND ANAEROBIC 6CC  Final   Culture NO GROWTH 2 DAYS  Final   Report Status PENDING  Incomplete  Urine culture     Status: None (Preliminary result)   Collection Time: 01/19/15  3:05 PM  Result Value Ref Range Status   Specimen Description URINE, RANDOM  Final   Special Requests NONE  Final   Culture HOLDING FOR POSSIBLE PATHOGEN  Final   Report Status PENDING  Incomplete  MRSA PCR Screening     Status: None   Collection Time: 01/19/15 11:38 PM  Result Value Ref Range Status   MRSA by PCR NEGATIVE NEGATIVE Final    Comment:        The GeneXpert MRSA Assay (FDA approved for NASAL specimens only), is one component of a comprehensive MRSA colonization surveillance program. It is not intended to diagnose MRSA infection nor to guide or monitor treatment for MRSA infections.   Body fluid culture (includes gram stain)     Status: None (Preliminary result)   Collection Time: 01/21/15 11:10 AM  Result Value Ref Range Status   Specimen Description FLUID PERITONEAL CAVITY  Final   Special Requests NONE  Final   Gram Stain PENDING  Incomplete   Culture PENDING  Incomplete   Report Status PENDING  Incomplete    Medical History: Past Medical History  Diagnosis Date  . Panic attack   . Hepatitis C 1995  . Bipolar affective (HCC)   . Chronic pain     legs  .  Heart murmur   . Hernia, umbilical     Protruding  . Anxiety   . Cirrhosis [facility](HCC)     Assessment: 59 y/o F found by home health nurse with AMS, [facility]. Starting broad spectrum antibiotics for abdominal wall cellulitis/rule out SBP. Received vanco[facility]zosyn x 1 in [facility] ED [facility]. WBC WNL. CrCl ~50. SCr 0.96 (at baseline).  Cultures pending.  Pt with no signs of sepsis but possible SBP. Could consider de-escalating therapy to ceftriaxone.   Goal of Therapy:  Eradication of infection   Plan:  -Zosyn 3.375g IV Q8 hr (4hr inf) -Renal fxn stable, pharmacy will sign off   Kelsey Durflinger C. Marvis MoellerMiles, PharmD Pharmacy Resident  Pager:  [phone] 01/21/2015 12:43 PM

## 2015-01-21 NOTE — Procedures (Signed)
Paracentesis Procedure Note  Pre-operative Diagnosis: Cirrhosis with ascites  Post-operative Diagnosis: same  Indications: Diagnostic - rule out SBP.  Procedure Details  Consent: Informed consent was obtained. Risks of the procedure were discussed including: infection, bleeding, pain, pneumothorax.  Under sterile conditions the patient was positioned. Clorahexadine solution and sterile drapes were utilized.  1% plain lidocaine was used to anesthetize the left lower quadrant. Fluid was obtained without any difficulties and minimal blood loss.  A dressing was applied to the wound and wound care instructions were provided.   Findings 3000 ml of amber colored peritoneal fluid was obtained. A sample was sent to Pathology for cytogenetics, flow, and cell counts, as well as for infection analysis.  Complications:  None; patient tolerated the procedure well.          Condition: stable  Plan Bed Rest for 1 hours. Tylenol 650 mg. for pain.   Rutherford Guysahul Anureet Bruington, [place] - [facility] [place] Pager: [phone]  or [phone] 01/21/2015, 11:08 AM   Attending Attestation: I was present for the entire procedure.

## 2015-01-22 LAB — COMPREHENSIVE METABOLIC PANEL
ALT: 19 U/L (ref 14–54)
AST: 40 U/L (ref 15–41)
Albumin: 1.6 g/dL — ABNORMAL LOW (ref 3.5–5.0)
Alkaline Phosphatase: 77 U/L (ref 38–126)
Anion gap: 5 (ref 5–15)
BUN: 8 mg/dL (ref 6–20)
CHLORIDE: 104 mmol/L (ref 101–111)
CO2: 19 mmol/L — ABNORMAL LOW (ref 22–32)
Calcium: 7.1 mg/dL — ABNORMAL LOW (ref 8.9–10.3)
Creatinine, Ser: 0.69 mg/dL (ref 0.44–1.00)
GFR calc Af Amer: >60 mL/min (ref >60)
Glucose, Bld: 107 mg/dL — ABNORMAL HIGH (ref 65–99)
POTASSIUM: 3.3 mmol/L — AB (ref 3.5–5.1)
Sodium: 128 mmol/L — ABNORMAL LOW (ref 135–145)
Total Bilirubin: 1.4 mg/dL — ABNORMAL HIGH (ref 0.3–1.2)
Total Protein: 5.2 g/dL — ABNORMAL LOW (ref 6.5–8.1)

## 2015-01-22 LAB — AFP TUMOR MARKER: AFP TUMOR MARKER: 2.4 ng/mL (ref 0.0–8.3)

## 2015-01-22 LAB — CBC WITH DIFFERENTIAL/PLATELET
BASOS ABS: 0 10*3/uL (ref 0.0–0.1)
BASOS PCT: 1 %
Eosinophils Absolute: 0.4 10*3/uL (ref 0.0–0.7)
Eosinophils Relative: 9 %
HEMATOCRIT: 26.1 % — AB (ref 36.0–46.0)
HEMOGLOBIN: 8.4 g/dL — AB (ref 12.0–15.0)
Lymphocytes Relative: 37 %
Lymphs Abs: 1.7 10*3/uL (ref 0.7–4.0)
MCH: 27.3 pg (ref 26.0–34.0)
MCHC: 32.2 g/dL (ref 30.0–36.0)
MCV: 84.7 fL (ref 78.0–100.0)
MONOS PCT: 15 %
Monocytes Absolute: 0.7 10*3/uL (ref 0.1–1.0)
NEUTROS ABS: 1.8 10*3/uL (ref 1.7–7.7)
NEUTROS PCT: 39 %
Platelets: 97 10*3/uL — ABNORMAL LOW (ref 150–400)
RBC: 3.08 MIL/uL — ABNORMAL LOW (ref 3.87–5.11)
RDW: 20.8 % — ABNORMAL HIGH (ref 11.5–15.5)
WBC: 4.6 10*3/uL (ref 4.0–10.5)

## 2015-01-22 LAB — AMMONIA: Ammonia: 38 umol/L — ABNORMAL HIGH (ref 9–35)

## 2015-01-22 LAB — URINE CULTURE

## 2015-01-22 LAB — APTT: APTT: 35 s (ref 24–37)

## 2015-01-22 LAB — PROTIME-INR
INR: 1.85 — AB (ref 0.00–1.49)
Prothrombin Time: 21.3 seconds — ABNORMAL HIGH (ref 11.6–15.2)

## 2015-01-22 LAB — GLUCOSE, CAPILLARY
GLUCOSE-CAPILLARY: 106 mg/dL — AB (ref 65–99)
GLUCOSE-CAPILLARY: 105 mg/dL — AB (ref 65–99)
GLUCOSE-CAPILLARY: 101 mg/dL — AB (ref 65–99)
Glucose-Capillary: 114 mg/dL — ABNORMAL HIGH (ref 65–99)

## 2015-01-22 LAB — MAGNESIUM: MAGNESIUM: 1.6 mg/dL — AB (ref 1.7–2.4)

## 2015-01-22 LAB — PHOSPHORUS: PHOSPHORUS: 2.2 mg/dL — AB (ref 2.5–4.6)

## 2015-01-22 MED ORDER — MAGNESIUM SULFATE 2 GM/50ML IV SOLN
2.0000 g | Freq: Once | INTRAVENOUS | Status: AC
  Administered 2015-01-22: 2 g via INTRAVENOUS
  Filled 2015-01-22: qty 50

## 2015-01-22 MED ORDER — POTASSIUM CHLORIDE CRYS ER 20 MEQ PO TBCR: 20.0000 meq | EXTENDED_RELEASE_TABLET | Freq: Every day | ORAL | Status: DC

## 2015-01-22 MED ORDER — POTASSIUM CHLORIDE CRYS ER 20 MEQ PO TBCR: 20.0000 meq | EXTENDED_RELEASE_TABLET | Freq: Every day | ORAL | Status: AC

## 2015-01-22 MED ORDER — POTASSIUM CHLORIDE CRYS ER 20 MEQ PO TBCR
40.0000 meq | EXTENDED_RELEASE_TABLET | Freq: Once | ORAL | Status: AC
  Administered 2015-01-22: 40 meq via ORAL
  Filled 2015-01-22: qty 2

## 2015-01-22 MED ORDER — AMOXICILLIN-POT CLAVULANATE 875-125 MG PO TABS: 1.0000 | ORAL_TABLET | Freq: Two times a day (BID) | ORAL | Status: AC

## 2015-01-22 NOTE — Discharge Summary (Addendum)
Physician Discharge Summary  Stephanie Beck MRN: [id number] DOB/AGE: 11/29/1955 59 y.o.  PCP: Albina Billet, MD   Admit date: 01/19/2015 Discharge date: 01/22/2015  Discharge Diagnoses:     Principal Problem:   Cellulitis of abdominal wall Active Problems:   Alcohol abuse   Umbilical hernia   Liver cirrhosis (HCC)   Ascites   Hyponatremia   Anemia   Protein-calorie malnutrition, severe (HCC)   Acute encephalopathy   AKI (acute kidney injury) ([place])   Sepsis (HCC)   Pressure ulcer   Cirrhosis (HCC)   Altered mental status  UTI   Follow-up recommendations Follow-up with PCP in 3-5 days , including all  additional recommended appointments as below Follow-up CBC, CMP in 3-5 days Patient will need close follow-up of her electrolytes, PT/INR PCP to follow-up on ascites fluid cytology, consider outpatient MRI of the liver    Medication List    STOP taking these medications        ALPRAZolam 1 MG tablet  Commonly known as:  XANAX     amphetamine-dextroamphetamine 30 MG tablet  Commonly known as:  ADDERALL     cefUROXime 250 MG tablet  Commonly known as:  CEFTIN     methadone 10 MG tablet  Commonly known as:  DOLOPHINE     sulfamethoxazole-trimethoprim 800-160 MG tablet  Commonly known as:  BACTRIM DS,SEPTRA DS      TAKE these medications        ciprofloxacin 500 MG tablet  Commonly known as:  CIPRO  Take 1 tablet (500 mg total) by mouth 2 (two) times a week. Given for SBP prophylaxis.     nadolol 20 MG tablet  Commonly known as:  CORGARD  Take 1 tablet (20 mg total) by mouth daily.     pantoprazole 40 MG tablet  Commonly known as:  PROTONIX  Take 1 tablet (40 mg total) by mouth daily.     potassium chloride SA 20 MEQ tablet  Commonly known as:  K-DUR,KLOR-CON  Take 1 tablet (20 mEq total) by mouth daily.     spironolactone 50 MG tablet  Commonly known as:  ALDACTONE  Take 50 mg by mouth 2 (two) times daily.     triamcinolone cream 0.1 %   Commonly known as:  KENALOG  Apply 1 application topically 2 (two) times daily.         Discharge Condition: Stable   Disposition: Home   Consults:  Critical care     Significant Diagnostic Studies:  Ct Head Wo Contrast  01/19/2015  CLINICAL DATA:  Altered mental status, possible increased medication administration EXAM: CT HEAD WITHOUT CONTRAST TECHNIQUE: Contiguous axial images were obtained from the base of the skull through the vertex without intravenous contrast. COMPARISON:  None. FINDINGS: Bony calvarium is intact. Atrophic changes are noted. No findings to suggest acute hemorrhage, acute infarction or space-occupying mass lesion are noted. IMPRESSION: Atrophic changes without acute abnormality. Electronically Signed   By: Inez Catalina M.D.   On: 01/19/2015 15:26   US Abdomen Complete  01/20/2015  CLINICAL DATA:  Cirrhosis EXAM: ULTRASOUND ABDOMEN COMPLETE COMPARISON:  11/30/2014 FINDINGS: Gallbladder: Multiple gallstones are identified. No significant gallbladder wall thickening is noted despite the degree of ascites. Common bile duct: Diameter: 6.5 mm. This is within normal limits for the patient's given age. Liver: Nodular appearing consistent with the underlying given clinical history. Multiple hypoechoic lesions are noted scattered throughout the liver which may represent regenerating nodules. Largest of these measures 1.8 cm in the  right lobe of the liver. Portal vein demonstrates minimal to no flow and is likely occluded IVC: No abnormality visualized. Pancreas: Visualized portion unremarkable. Spleen: Size and appearance within normal limits. Right Kidney: Length: 13.2 cm. Echogenicity within normal limits. No mass or hydronephrosis visualized. Left Kidney: Length: 10.3 cm. Echogenicity within normal limits. No mass or hydronephrosis visualized. Abdominal aorta: No aneurysm visualized. Other findings: Diffuse ascites is identified. IMPRESSION: Changes of cirrhosis and  ascites. 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 Multiple gallstones without complicating factors. Electronically Signed   By: Inez Catalina M.D.   On: 01/20/2015 12:33   US Transvaginal Non-ob  01/13/2015  CLINICAL DATA:  Vaginal bleeding for 2 days EXAM: TRANSABDOMINAL AND TRANSVAGINAL ULTRASOUND OF PELVIS TECHNIQUE: Both transabdominal and transvaginal ultrasound examinations of the pelvis were performed. Transabdominal technique was performed for global imaging of the pelvis including uterus, ovaries, adnexal regions, and pelvic cul-de-sac. It was necessary to proceed with endovaginal exam following the transabdominal exam to visualize the ovaries. COMPARISON:  11/30/2014 FINDINGS: Uterus Measurements: 6.1 x 2.3 x 3.4 cm. No fibroids or other mass visualized. Endometrium Thickness: 16 mm.  No focal abnormality visualized. Right ovary Not well seen Left ovary Not well seen Other findings Significant ascites is noted within the pelvis. This is similar to that noted on 11/30/14 IMPRESSION: Nonvisualization of the ovaries. Large amount of ascites. Prominent endometrium likely related to the current clinical history. Electronically Signed   By: Inez Catalina M.D.   On: 01/13/2015 11:18   US Pelvis Complete  01/13/2015  CLINICAL DATA:  Vaginal bleeding for 2 days EXAM: TRANSABDOMINAL AND TRANSVAGINAL ULTRASOUND OF PELVIS TECHNIQUE: Both transabdominal and transvaginal ultrasound examinations of the pelvis were performed. Transabdominal technique was performed for global imaging of the pelvis including uterus, ovaries, adnexal regions, and pelvic cul-de-sac. It was necessary to proceed with endovaginal exam following the transabdominal exam to visualize the ovaries. COMPARISON:  11/30/2014 FINDINGS: Uterus Measurements: 6.1 x 2.3 x 3.4 cm. No fibroids or other mass visualized. Endometrium Thickness: 16 mm.  No focal abnormality visualized. Right ovary Not well seen Left ovary  Not well seen Other findings Significant ascites is noted within the pelvis. This is similar to that noted on 11/30/14 IMPRESSION: Nonvisualization of the ovaries. Large amount of ascites. Prominent endometrium likely related to the current clinical history. Electronically Signed   By: Inez Catalina M.D.   On: 01/13/2015 11:18   Ct Abdomen Pelvis W Contrast  01/20/2015  CLINICAL DATA:  Evaluate umbilical hernia. EXAM: CT ABDOMEN AND PELVIS WITH CONTRAST TECHNIQUE: Multidetector CT imaging of the abdomen and pelvis was performed using the standard protocol following bolus administration of intravenous contrast. CONTRAST:  132m OMNIPAQUE IOHEXOL 300 MG/ML  SOLN COMPARISON:  CT abdomen pelvis 10/14/2012 FINDINGS: Lower chest: Normal heart size. Small bilateral pleural effusions. Dependent atelectasis within the bilateral lower lobes. Hepatobiliary: Liver is markedly cirrhotic in morphology and nodular in appearance. There is a more focal 1 cm exophytic nodule off the left hepatic lobe (image 26; series 201). Additionally adjacent to this is a perfusion anomaly. Within the right hepatic lobe (image 6; series 31) there is suggestion of a 1.7 cm low-attenuation nodule. Possible 1.6 cm low-attenuation nodule within the hepatic dome (image 1; series 301). Multiple stones within the gallbladder lumen. No gallbladder wall thickening. Pancreas: Unremarkable Spleen: Unremarkable Adrenals/Urinary Tract: The adrenal glands are normal. Kidneys enhance symmetrically with contrast. Foley catheter terminates within a decompressed urinary bladder. Stomach/Bowel: No abnormal bowel  wall thickening or evidence for bowel obstruction. Vascular/Lymphatic: Normal caliber abdominal aorta. The splenic vein is patent. The superior mesenteric vein is patent to the level of confluence. Portal vein is not opacified and is likely thrombosed. Multiple collateral vessels are demonstrated emanating off of the superior mesenteric and splenic veins.  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 Multiple prominent retroperitoneal and upper abdominal mesenteric lymph nodes are demonstrated including a 1.7 cm gastrohepatic lymph node (image 28; series 201) and a 1.5 cm aortocaval lymph node (image 41; series 201). Other: Large volume ascites throughout the abdomen. Uterus and adnexal structures are unremarkable. Interval development of periumbilical hernia containing omental fat and multiple collateral vessels. No evidence for herniated bowel. Musculoskeletal: Diffuse anasarca. Lumbar spine degenerative changes. No aggressive or acute appearing osseous lesions. IMPRESSION: Interval development of a periumbilical hernia containing omental fat and multiple collateral vessels. The portal vein is not opacified with contrast and likely thrombosed. There are multiple collateral vessels including upper abdominal and paraesophageal varices. Cirrhotic morphology to the liver. This has progressed when compared to prior exam. Additionally there are multiple low-attenuation nodules on the delayed phase images raising possibility of [place]. Recommend dedicated evaluation of the liver with pre and post contrast-enhanced MRI. Multiple stones within the gallbladder lumen. Large volume ascites.  Diffuse anasarca. These results will be called to the ordering clinician or representative by the Radiologist Assistant, and communication documented in the PACS or zVision Dashboard. Electronically Signed   By: Lovey Newcomer M.D.   On: 01/20/2015 13:24   Dg Chest Portable 1 View  01/19/2015  CLINICAL DATA:  59 year old with possible drug overdose with Xanax and/or methadone. Patient found with acute mental status changes and lethargy by the home health nurse earlier today. EXAM: PORTABLE CHEST 1 VIEW COMPARISON:  None. FINDINGS: Near expiratory image which accounts for mild atelectasis in the lung bases and accentuates the cardiac silhouette. Taking this into account, cardiac silhouette  likely normal in size. Lungs otherwise clear. No localized airspace consolidation. No pleural effusions. No pneumothorax. Normal pulmonary vascularity. IMPRESSION: Suboptimal inspiration accounts for mild bibasilar atelectasis. No acute cardiopulmonary disease otherwise. Electronically Signed   By: Evangeline Dakin M.D.   On: 01/19/2015 15:16       Filed Weights   01/20/15 0441 01/21/15 0318 01/21/15 2018  Weight: 57.6 kg (126 lb 15.8 oz) 59.4 kg (130 lb 15.3 oz) 59.6 kg (131 lb 6.3 oz)     Microbiology: Recent Results (from the past 240 hour(s))  Urine culture     Status: None   Collection Time: 01/12/15  7:47 PM  Result Value Ref Range Status   Specimen Description URINE, CLEAN CATCH  Final   Special Requests Normal  Final   Culture MULTIPLE SPECIES PRESENT, SUGGEST RECOLLECTION  Final   Report Status 01/14/2015 FINAL  Final  Culture, blood (routine x 2)     Status: None (Preliminary result)   Collection Time: 01/19/15  3:00 PM  Result Value Ref Range Status   Specimen Description BLOOD LEFT ARM  Final   Special Requests BAA, 5CC  Final   Culture NO GROWTH 3 DAYS  Final   Report Status PENDING  Incomplete  Culture, blood (routine x 2)     Status: None (Preliminary result)   Collection Time: 01/19/15  3:05 PM  Result Value Ref Range Status   Specimen Description BLOOD LEFT ARM  Final   Special Requests BOTTLES DRAWN AEROBIC AND ANAEROBIC 6CC  Final   Culture NO GROWTH 3 DAYS  Final   Report Status PENDING  Incomplete  Urine culture     Status: None   Collection Time: 01/19/15  3:05 PM  Result Value Ref Range Status   Specimen Description URINE, RANDOM  Final   Special Requests NONE  Final   Culture   Final    60,000 COLONIES/ml ENTEROCOCCUS FAECALIS MIXED BACTERIAL ORGANISMS    Report Status 01/22/2015 FINAL  Final   Organism ID, Bacteria ENTEROCOCCUS FAECALIS  Final      Susceptibility   Enterococcus faecalis - MIC*    AMPICILLIN <=2 SENSITIVE Sensitive     LINEZOLID  2 SENSITIVE Sensitive     CIPROFLOXACIN Value in next row Sensitive      SENSITIVE1    LEVOFLOXACIN Value in next row Sensitive      SENSITIVE1    NITROFURANTOIN Value in next row Sensitive      SENSITIVE32    TETRACYCLINE Value in next row Sensitive      SENSITIVE<=1    * 60,000 COLONIES/ml ENTEROCOCCUS FAECALIS  MRSA PCR Screening     Status: None   Collection Time: 01/19/15 11:38 PM  Result Value Ref Range Status   MRSA by PCR NEGATIVE NEGATIVE Final    Comment:        The GeneXpert MRSA Assay (FDA approved for NASAL specimens only), is one component of a comprehensive MRSA colonization surveillance program. It is not intended to diagnose MRSA infection nor to guide or monitor treatment for MRSA infections.   Culture, blood (routine x 2)     Status: None (Preliminary result)   Collection Time: 01/20/15  2:35 AM  Result Value Ref Range Status   Specimen Description BLOOD RIGHT ANTECUBITAL  Final   Special Requests BOTTLES DRAWN AEROBIC AND ANAEROBIC 10CC  Final   Culture NO GROWTH 1 DAY  Final   Report Status PENDING  Incomplete  Culture, blood (routine x 2)     Status: None (Preliminary result)   Collection Time: 01/20/15  2:44 AM  Result Value Ref Range Status   Specimen Description BLOOD BLOOD RIGHT FOREARM  Final   Special Requests BOTTLES DRAWN AEROBIC AND ANAEROBIC 5CC  Final   Culture NO GROWTH 1 DAY  Final   Report Status PENDING  Incomplete  Body fluid culture (includes gram stain)     Status: None (Preliminary result)   Collection Time: 01/21/15 11:10 AM  Result Value Ref Range Status   Specimen Description FLUID PERITONEAL CAVITY  Final   Special Requests NONE  Final   Gram Stain   Final    FEW WBC PRESENT,BOTH PMN AND MONONUCLEAR NO ORGANISMS SEEN    Culture NO GROWTH 1 DAY  Final   Report Status PENDING  Incomplete       Blood Culture    Component Value Date/Time   SDES FLUID PERITONEAL CAVITY 01/21/2015 1110   SPECREQUEST NONE 01/21/2015 1110    CULT NO GROWTH 1 DAY 01/21/2015 1110   REPTSTATUS PENDING 01/21/2015 1110      Labs: Results for orders placed or performed during the hospital encounter of 01/19/15 (from the past 48 hour(s))  Glucose, capillary     Status: Abnormal   Collection Time: 01/20/15  1:49 PM  Result Value Ref Range   Glucose-Capillary 126 (H) 65 - 99 mg/dL  Glucose, capillary     Status: None   Collection Time: 01/20/15  7:04 PM  Result Value Ref Range   Glucose-Capillary 81 65 - 99 mg/dL  Glucose, capillary  Status: None   Collection Time: 01/20/15 11:20 PM  Result Value Ref Range   Glucose-Capillary 83 65 - 99 mg/dL  Comprehensive metabolic panel     Status: Abnormal   Collection Time: 01/21/15  2:15 AM  Result Value Ref Range   Sodium 134 (L) 135 - 145 mmol/L   Potassium 3.8 3.5 - 5.1 mmol/L   Chloride 107 101 - 111 mmol/L   CO2 22 22 - 32 mmol/L   Glucose, Bld 101 (H) 65 - 99 mg/dL   BUN 13 6 - 20 mg/dL   Creatinine, Ser 0.96 0.44 - 1.00 mg/dL   Calcium 7.5 (L) 8.9 - 10.3 mg/dL   Total Protein 4.8 (L) 6.5 - 8.1 g/dL   Albumin 1.3 (L) 3.5 - 5.0 g/dL   AST 33 15 - 41 U/L   ALT 18 14 - 54 U/L   Alkaline Phosphatase 73 38 - 126 U/L   Total Bilirubin 1.8 (H) 0.3 - 1.2 mg/dL   GFR calc non Af Amer >60 >60 mL/min   GFR calc Af Amer >60 >60 mL/min    Comment: (NOTE) The eGFR has been calculated using the CKD EPI equation. This calculation has not been validated in all clinical situations. eGFR's persistently <60 mL/min signify possible Chronic Kidney Disease.    Anion gap 5 5 - 15  Magnesium     Status: None   Collection Time: 01/21/15  2:15 AM  Result Value Ref Range   Magnesium 1.8 1.7 - 2.4 mg/dL  Phosphorus     Status: None   Collection Time: 01/21/15  2:15 AM  Result Value Ref Range   Phosphorus 3.4 2.5 - 4.6 mg/dL  CBC with Differential/Platelet     Status: Abnormal   Collection Time: 01/21/15  2:15 AM  Result Value Ref Range   WBC 4.5 4.0 - 10.5 K/uL   RBC 3.17 (L) 3.87 -  5.11 MIL/uL   Hemoglobin 8.3 (L) 12.0 - 15.0 g/dL   HCT 26.7 (L) 36.0 - 46.0 %   MCV 84.2 78.0 - 100.0 fL   MCH 26.2 26.0 - 34.0 pg   MCHC 31.1 30.0 - 36.0 g/dL   RDW 20.3 (H) 11.5 - 15.5 %   Platelets 99 (L) 150 - 400 K/uL    Comment: CONSISTENT WITH PREVIOUS RESULT   Neutrophils Relative % 39 %   Neutro Abs 1.8 1.7 - 7.7 K/uL   Lymphocytes Relative 37 %   Lymphs Abs 1.7 0.7 - 4.0 K/uL   Monocytes Relative 17 %   Monocytes Absolute 0.8 0.1 - 1.0 K/uL   Eosinophils Relative 6 %   Eosinophils Absolute 0.3 0.0 - 0.7 K/uL   Basophils Relative 0 %   Basophils Absolute 0.0 0.0 - 0.1 K/uL  Protime-INR     Status: Abnormal   Collection Time: 01/21/15  2:15 AM  Result Value Ref Range   Prothrombin Time 19.9 (H) 11.6 - 15.2 seconds   INR 1.69 (H) 0.00 - 1.49  APTT     Status: None   Collection Time: 01/21/15  2:15 AM  Result Value Ref Range   aPTT 33 24 - 37 seconds  Glucose, capillary     Status: Abnormal   Collection Time: 01/21/15  3:03 AM  Result Value Ref Range   Glucose-Capillary 104 (H) 65 - 99 mg/dL  Glucose, capillary     Status: None   Collection Time: 01/21/15  8:23 AM  Result Value Ref Range   Glucose-Capillary 98 65 -  99 mg/dL  Albumin, pleural fluid     Status: None   Collection Time: 01/21/15 11:10 AM  Result Value Ref Range   Albumin, Fluid <1.0 g/dL    Comment: REPEATED TO VERIFY (NOTE) No normal range established for this test Results should be evaluated in conjunction with serum values    Fluid Type-FALB PERITONEAL CAVITY   Body fluid cell count with differential     Status: Abnormal   Collection Time: 01/21/15 11:10 AM  Result Value Ref Range   Fluid Type-FCT PERITONEAL CAVITY    Color, Fluid YELLOW YELLOW   Appearance, Fluid CLEAR CLEAR   WBC, Fluid 31 0 - 1000 cu mm   Neutrophil Count, Fluid 33 (H) 0 - 25 %   Lymphs, Fluid 36 %   Monocyte-Macrophage-Serous Fluid 30 (L) 50 - 90 %   Eos, Fluid 1 %  Lactate dehydrogenase, body fluid     Status:  Abnormal   Collection Time: 01/21/15 11:10 AM  Result Value Ref Range   LD, Fluid 28 (H) 3 - 23 U/L    Comment: (NOTE) Results should be evaluated in conjunction with serum values    Fluid Type-FLDH PERITONEAL CAVITY   Protein, pleural or peritoneal fluid     Status: None   Collection Time: 01/21/15 11:10 AM  Result Value Ref Range   Total protein, fluid <3.0 g/dL    Comment: REPEATED TO VERIFY (NOTE) No normal range established for this test Results should be evaluated in conjunction with serum values    Fluid Type-FTP PERITONEAL CAVITY   Body fluid culture (includes gram stain)     Status: None (Preliminary result)   Collection Time: 01/21/15 11:10 AM  Result Value Ref Range   Specimen Description FLUID PERITONEAL CAVITY    Special Requests NONE    Gram Stain      FEW WBC PRESENT,BOTH PMN AND MONONUCLEAR NO ORGANISMS SEEN    Culture NO GROWTH 1 DAY    Report Status PENDING   Lactate dehydrogenase     Status: Abnormal   Collection Time: 01/21/15 11:14 AM  Result Value Ref Range   LDH 239 (H) 98 - 192 U/L  Glucose, capillary     Status: Abnormal   Collection Time: 01/21/15 12:10 PM  Result Value Ref Range   Glucose-Capillary 128 (H) 65 - 99 mg/dL   Comment 1 Notify RN   AFP tumor marker     Status: None   Collection Time: 01/21/15 12:20 PM  Result Value Ref Range   AFP-Tumor Marker 2.4 0.0 - 8.3 ng/mL    Comment: (NOTE) [employer] ECLIA methodology Performed At: [facility], [place] [id number] Lindon Romp MD EZ:[id number]   Glucose, capillary     Status: Abnormal   Collection Time: 01/21/15  8:16 PM  Result Value Ref Range   Glucose-Capillary 156 (H) 65 - 99 mg/dL  Glucose, capillary     Status: Abnormal   Collection Time: 01/21/15 11:35 PM  Result Value Ref Range   Glucose-Capillary 114 (H) 65 - 99 mg/dL  Glucose, capillary     Status: Abnormal   Collection Time: 01/22/15  4:20 AM  Result Value Ref Range   Glucose-Capillary  106 (H) 65 - 99 mg/dL  Comprehensive metabolic panel     Status: Abnormal   Collection Time: 01/22/15  5:30 AM  Result Value Ref Range   Sodium 128 (L) 135 - 145 mmol/L   Potassium 3.3 (L) 3.5 - 5.1 mmol/L  Chloride 104 101 - 111 mmol/L   CO2 19 (L) 22 - 32 mmol/L   Glucose, Bld 107 (H) 65 - 99 mg/dL   BUN 8 6 - 20 mg/dL   Creatinine, Ser 0.69 0.44 - 1.00 mg/dL   Calcium 7.1 (L) 8.9 - 10.3 mg/dL   Total Protein 5.2 (L) 6.5 - 8.1 g/dL   Albumin 1.6 (L) 3.5 - 5.0 g/dL   AST 40 15 - 41 U/L   ALT 19 14 - 54 U/L   Alkaline Phosphatase 77 38 - 126 U/L   Total Bilirubin 1.4 (H) 0.3 - 1.2 mg/dL   GFR calc non Af Amer >60 >60 mL/min   GFR calc Af Amer >60 >60 mL/min    Comment: (NOTE) The eGFR has been calculated using the CKD EPI equation. This calculation has not been validated in all clinical situations. eGFR's persistently <60 mL/min signify possible Chronic Kidney Disease.    Anion gap 5 5 - 15  Magnesium     Status: Abnormal   Collection Time: 01/22/15  5:30 AM  Result Value Ref Range   Magnesium 1.6 (L) 1.7 - 2.4 mg/dL  Phosphorus     Status: Abnormal   Collection Time: 01/22/15  5:30 AM  Result Value Ref Range   Phosphorus 2.2 (L) 2.5 - 4.6 mg/dL  CBC with Differential/Platelet     Status: Abnormal   Collection Time: 01/22/15  5:30 AM  Result Value Ref Range   WBC 4.6 4.0 - 10.5 K/uL   RBC 3.08 (L) 3.87 - 5.11 MIL/uL   Hemoglobin 8.4 (L) 12.0 - 15.0 g/dL   HCT 26.1 (L) 36.0 - 46.0 %   MCV 84.7 78.0 - 100.0 fL   MCH 27.3 26.0 - 34.0 pg   MCHC 32.2 30.0 - 36.0 g/dL   RDW 20.8 (H) 11.5 - 15.5 %   Platelets 97 (L) 150 - 400 K/uL    Comment: CONSISTENT WITH PREVIOUS RESULT   Neutrophils Relative % 39 %   Neutro Abs 1.8 1.7 - 7.7 K/uL   Lymphocytes Relative 37 %   Lymphs Abs 1.7 0.7 - 4.0 K/uL   Monocytes Relative 15 %   Monocytes Absolute 0.7 0.1 - 1.0 K/uL   Eosinophils Relative 9 %   Eosinophils Absolute 0.4 0.0 - 0.7 K/uL   Basophils Relative 1 %   Basophils  Absolute 0.0 0.0 - 0.1 K/uL  Protime-INR     Status: Abnormal   Collection Time: 01/22/15  5:30 AM  Result Value Ref Range   Prothrombin Time 21.3 (H) 11.6 - 15.2 seconds   INR 1.85 (H) 0.00 - 1.49  APTT     Status: None   Collection Time: 01/22/15  5:30 AM  Result Value Ref Range   aPTT 35 24 - 37 seconds  Glucose, capillary     Status: Abnormal   Collection Time: 01/22/15  7:30 AM  Result Value Ref Range   Glucose-Capillary 105 (H) 65 - 99 mg/dL  Ammonia     Status: Abnormal   Collection Time: 01/22/15 11:24 AM  Result Value Ref Range   Ammonia 38 (H) 9 - 35 umol/L  Glucose, capillary     Status: Abnormal   Collection Time: 01/22/15 11:47 AM  Result Value Ref Range   Glucose-Capillary 101 (H) 65 - 99 mg/dL     Lipid Panel  No results found for: CHOL, TRIG, HDL, CHOLHDL, VLDL, LDLCALC, LDLDIRECT   No results found for: HGBA1C   Lab Results  Component Value Date   CREATININE 0.69 01/22/2015     HPI 59yo female with history of hep C cirrhosis, chronic ulcerated umbilical hernia, alcohol abuse, and anxiety who presented to [facility] ED with altered mental status and hypothermia likely secondary to sepsis due to cellulitis of her abdominal wall and ulcerated hernia  HOSPITAL COURSE:  Acute renal failure-creatinine has improved from 1.25-0.69 Follow creatinine in the outpatient setting, and the patient's Aldactone is being restarted  Hyponatremia in the setting of cirrhosis, mild, monitor, needs close outpt monitoring   Acute encephalopathy secondary to urinary tract infection secondary to Enterococcus faecalis, discontinued Xanax, ammonia 38 prior to discharge,   complete antibiotic treatment for UTI  Hep C cirrhosis, thrombosed portal vein, several liver nodules on CT, ?HCC/ascites Status post paracentesis and removal of 3 L of fluid, ascitic fluid culture and cytology pending, patient adamant on going home today,, doubt SBP, patient is being discharged on  Augmentin for 10 days for UTI Continue ciprofloxacin for SBP prophylaxis Will need abdominal MRI to further characterize liver nodules, alpha-fetoprotein was negative Critical care recommends not to anticoagulate for portal vein thrombosis  Ulcerated umbilical hernia with abdominal wall cellulitis/UTI -patient will continue with Augmentin and ciprofloxacin for SBP prophylaxis  H/o GI bleed - worked up in the past, thought to be slow bleed to be managed conservatively continue PPI  Anemia - likely related to chronic liver disease and slow GI bleed  S/P 1u PRBC, baseline hemoglobin around 8.4  Abdominal Wall Cellulitis - secondary to ulcerated umbilical hernia, home health order to continue dressing changes, patient will be discharged home on Augmentin and ciprofloxacin umbilical hernia site appears raw but does not seem to have active infection.  Status post receiving vancomycin and Zosyn for 3 days   Discharge Exam:    Blood pressure 142/73, pulse 88, temperature 98.1 F (36.7 C), temperature source Oral, resp. rate 19, height 5' 2"  (1.575 m), weight 59.6 kg (131 lb 6.3 oz), SpO2 100 %.   General: Laying in bed. Sitter at bedside. No acute distress.  Integument: Warm & dry. No rash on exposed skin. Superficial breakdown on protruding umbilical hernia. HEENT: Dry mucus membranes. No oral ulcers. No scleral injection.  Cardiovascular: Regular rate. No edema. No appreciable JVD.  Pulmonary: Good aeration & clear to auscultation bilaterally. Symmetric chest wall rise. No accessory muscle use on room air. Abdomen: Soft. Normal bowel sounds. Nondistended.  Oriented x4. Moves all extremities spontaneously      Discharge Instructions    Diet - low sodium heart healthy    Complete by:  As directed      Increase activity slowly    Complete by:  As directed              Signed: Khoury Siemon 01/22/2015, 1:25 PM        Time spent >45 mins

## 2015-01-22 NOTE — Progress Notes (Signed)
Patient discharged home,picked up by friend. Stephanie Beck, Stephanie Beck Stephanie Beck, Stephanie Beck fundraiser

## 2015-01-22 NOTE — Care Management Note (Addendum)
Case Management Note  Patient Details  Name: Stephanie Beck MRN: [id number] Date of Birth: 05-19-1955  Subjective/Objective:     59 y.o. F admitted with Hepatic Encephalopathy. Will be discharged today after evaluation by PT/OT. [facility]. Will add HHRN to perform Daily Dressing changes to abdominal wall  X 2 weeks and HH Aide.  No DME noted as of yet.   [name] [phone]) confirmed HH needs.          Action/Plan:CM will be available or any changes in discharge needs.    Expected Discharge Date:                  Expected Discharge Plan:  Home w Home Health Services  In-House Referral:     Discharge planning Services  CM Consult  Post Acute Care Choice:  Resumption of Svcs/PTA Provider Choice offered to:  Patient  DME Arranged:    DME Agency:  [facility]  HH Arranged:  PT, RN, Nurse's Aide HH Agency:  [facility]  Status of Service:  Completed, signed off  Medicare Important Message Given:  Yes-second notification given Date Medicare IM Given:    Medicare IM give by:    Date Additional Medicare IM Given:    Additional Medicare Important Message give by:     If discussed at Long Length of Stay Meetings, dates discussed:    Additional Comments:  Yvone NeuCrutchfield, Rhonin Trott M, RN 01/22/2015, 11:30 AM

## 2015-01-22 NOTE — Progress Notes (Signed)
Patient refused dressing change to her abdominal wound. Patient was educated on need of dressing change and when and how wound should be dressed. Patient stated she had "a professional change it at home".

## 2015-01-22 NOTE — Progress Notes (Signed)
Patient awaiting discharge ride from friend. Patient was given prescriptions and discharge paperwork. Patient had belongings. Patient declined dressing change to abdominal wound. Patient stable.

## 2015-01-22 NOTE — Care Management Important Message (Signed)
Important Message  Patient Details  Name: Stephanie Beck MRN: [id number] Date of Birth: August 05, 1955   Medicare Important Message Given:  Yes-second notification given    Orson AloeMegan P Kayston Jodoin 01/22/2015, 11:09 AM

## 2015-01-22 NOTE — Evaluation (Signed)
Physical Therapy Evaluation Patient Details Name: Stephanie Beck MRN: [id number] DOB: 23-Jul-1955 Today's Date: 01/22/2015   History of Present Illness  59yo female with history of hep C cirrhosis, chronic ulcerated umbilical hernia, alcohol abuse, and anxiety who presented to [facility] ED with altered mental status and hypothermia likely secondary to sepsis due to cellulitis of her abdominal wall and ulcerated hernia.   Clinical Impression  Patient in bed, eager to participate in PT today in anticipation of imminent d/c home. Patient was able to ambulate and navigate stairs as described below. Patient will benefit from continued PT while in the hospital to increase independence with stairs although patient is safe to go home at this time with supervision from friend who lives with her (patient reports friend is available 24/7). She will benefit from HHPT, which she is eager to resume, to increase her strength and gait speed.    Follow Up Recommendations Home health PT (Patient excited about continuing HHPT to get stronger.)    Equipment Recommendations  None recommended by PT    Recommendations for Other Services       Precautions / Restrictions Precautions Precautions: Fall Restrictions Weight Bearing Restrictions: No      Mobility  Bed Mobility Overal bed mobility: Independent             General bed mobility comments: Able to get OOB without physical assistance or use of bed rails.  Transfers Overall transfer level: Independent Equipment used: None Transfers: Sit to/from Stand Sit to Stand: Independent         General transfer comment: Patient able to stand and stabilize without physical assistance. Initially reached hand to tray but did not need it for balance. Reports feeling stiff.  Ambulation/Gait Ambulation/Gait assistance: Independent Ambulation Distance (Feet): 200 Feet Assistive device: None Gait Pattern/deviations: Step-through  pattern;Decreased stride length;Decreased dorsiflexion - right;Decreased dorsiflexion - left;Narrow base of support Gait velocity: Decreased Gait velocity interpretation: Below normal speed for age/gender General Gait Details: Patient with stiff gait. Reports she feels stiff due to not getting OOB. Was able to walk at slightly decreased but' safe speed without any LOB but very hesitant to perform head turns while walking - stopped to turn head and talk to nurse in hallway.  Stairs Stairs: Yes Stairs assistance: Supervision Stair Management: One rail Right;Step to pattern;Alternating pattern;Forwards Number of Stairs: 6 (3 stairs 2 times) General stair comments: First time going up, patient was slightly unsteady, but no LOB noted. Cues for use of railings and for slow, safe speed with stair navigation. Patient desired to practice stairs again to become more comfortable, stating "I will do these until I feel better." Upon second time, patient with much safer speed and use of railings. Reported she felt confident and that her friend, who lives with her, would be there when she returns home. She did not require any physical assistance for LOB either time going up stairs. Step-to pattern on the way up and alternating coming down.  Wheelchair Mobility    Modified Rankin (Stroke Patients Only)       Balance Overall balance assessment: Independent                                           Pertinent Vitals/Pain Pain Assessment: No/denies pain    Home Living Family/patient expects to be discharged to:: Private residence Living Arrangements: Non-relatives/Friends Available Help at  Discharge: Friend(s);Available 24 hours/day Type of Home: House Home Access: Stairs to enter Entrance Stairs-Rails: Right Entrance Stairs-Number of Steps: 2 Home Layout: One level Home Equipment: Walker - 4 wheels;Other (comment);Cane - single point;Bedside commode;Shower seat (3 wheeled walker)    Patient not in need of Rollator use at this time. However, she was educated on signs to notice at home when she might need to use an AD, and to have it available for longer trips such as to the grocery store.    Prior Function Level of Independence: Needs assistance   Gait / Transfers Assistance Needed: Independent, no AD use.  ADL's / Homemaking Assistance Needed: Independent bathing/dressing, assistance with cooking, cleaning, grocery shopping.  Comments: Patient states she does not use any ADs.      Hand Dominance   Dominant Hand: Right    Extremity/Trunk Assessment   Upper Extremity Assessment: Overall WFL for tasks assessed           Lower Extremity Assessment: Generalized weakness         Communication   Communication: No difficulties  Cognition Arousal/Alertness: Awake/alert Behavior During Therapy: WFL for tasks assessed/performed Overall Cognitive Status: Within Functional Limits for tasks assessed                      General Comments      Exercises        Assessment/Plan    PT Assessment All further PT needs can be met in the next venue of care  PT Diagnosis Difficulty walking;Generalized weakness   PT Problem List Decreased strength;Decreased activity tolerance;Decreased mobility;Decreased skin integrity  PT Treatment Interventions     PT Goals (Current goals can be found in the Care Plan section) Acute Rehab PT Goals Patient Stated Goal: To get back home PT Goal Formulation: With patient Time For Goal Achievement: 01/29/15 Potential to Achieve Goals: Good    Frequency     Barriers to discharge   Patient reports 24/7 assistance at home.     Co-evaluation               End of Session Equipment Utilized During Treatment: Gait belt Activity Tolerance: Patient tolerated treatment well Patient left: in bed;with call bell/phone within reach;Other (comment) (Sitting EOB.) Nurse Communication: Mobility status         Time:  1425-1445 PT Time Calculation (min) (ACUTE ONLY): 20 min   Charges:   PT Evaluation $Initial PT Evaluation Tier I: 1 Procedure     PT G CodesRoanna Epley, SPT [phone] 01/22/2015, 2:59 PM  I have read, reviewed and agree with student's note.   [place] [phone] (pager)

## 2015-01-24 LAB — BODY FLUID CULTURE: Culture: NO GROWTH

## 2015-01-25 LAB — CULTURE, BLOOD (ROUTINE X 2)
CULTURE: NO GROWTH
Culture: NO GROWTH
Culture: NO GROWTH
Culture: NO GROWTH

## 2015-03-11 DEATH — deceased

## 2015-12-28 IMAGING — US US TRANSVAGINAL NON-OB
1 series · 14 of 25 positions shown · non-contrast
Comparison: 11/30/2014

CLINICAL DATA: Vaginal bleeding for 2 days



[Series 1: us transvaginal non-ob · 0.21mm/px · 14 of 63 slices shown]
[im 1/63]
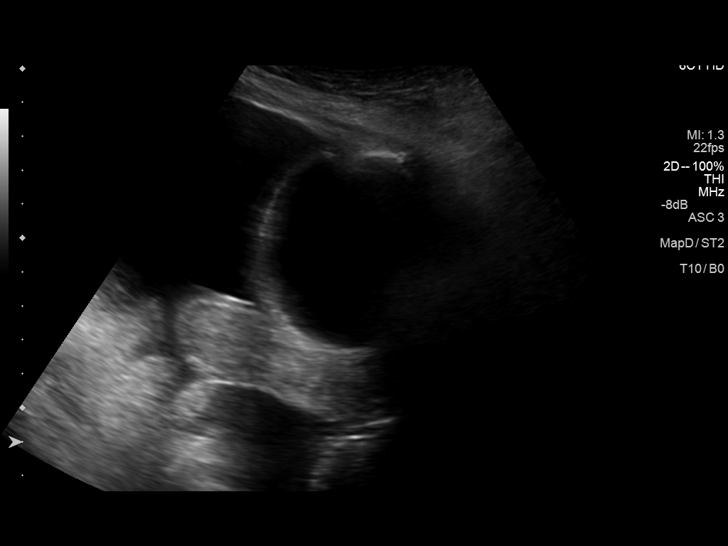
[im 6/63]
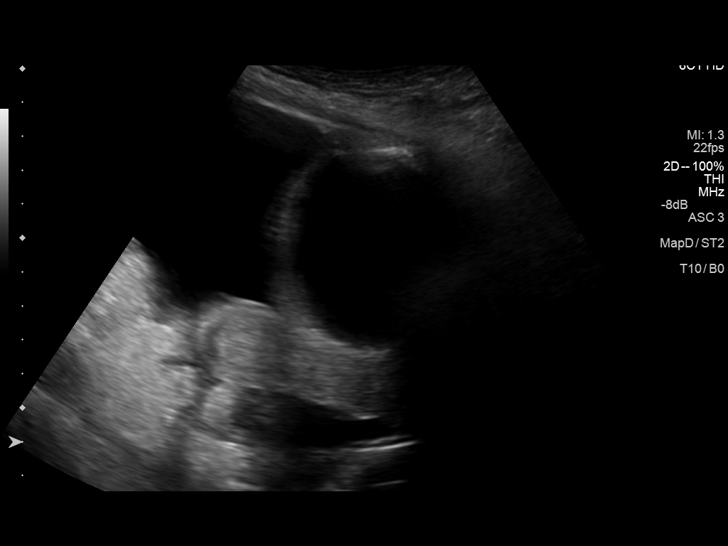
[im 11/63]
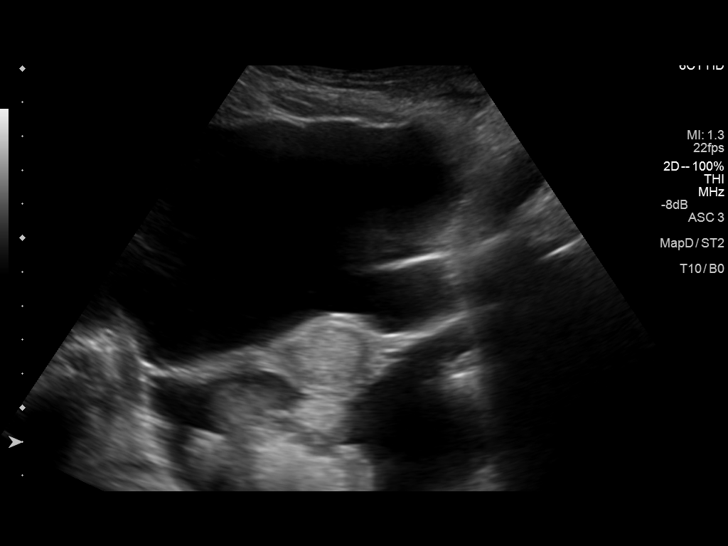
[im 16/63]
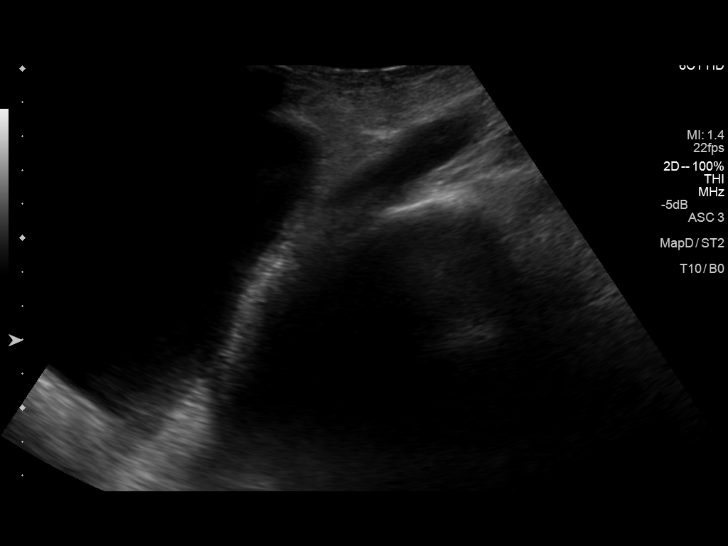
[im 21/63]
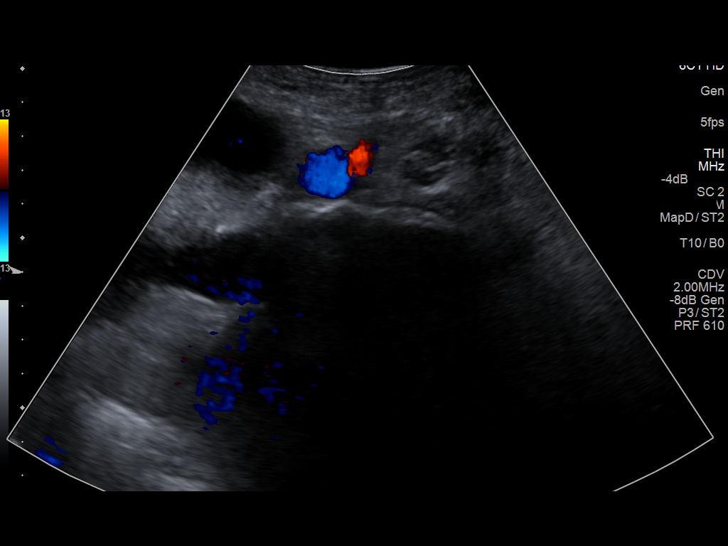
[im 24/63]
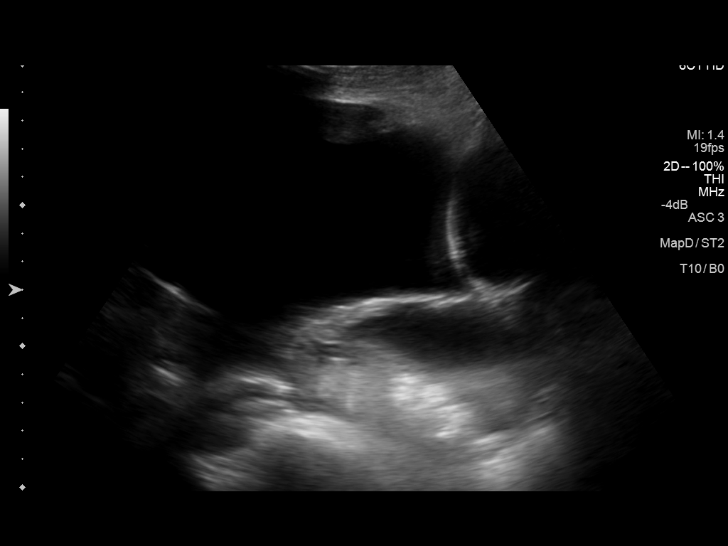
[im 29/63]
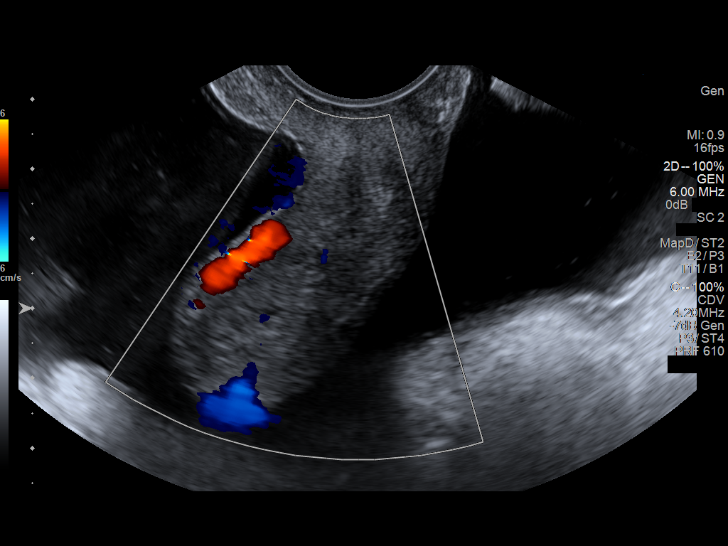
[im 34/63]
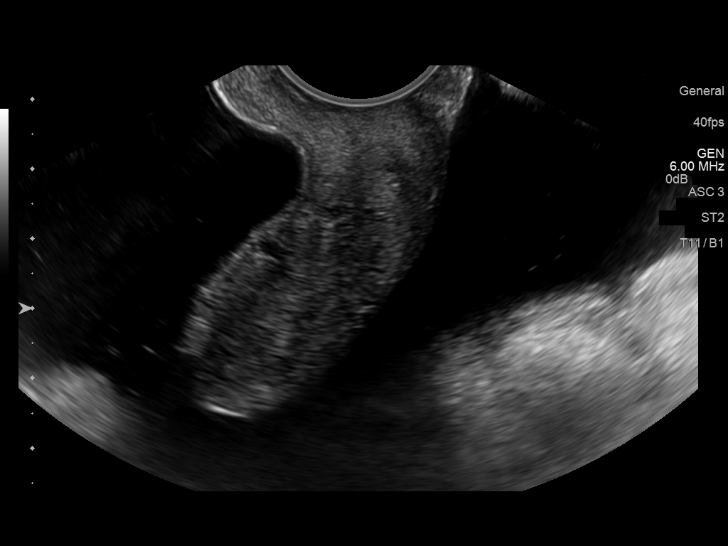
[im 39/63]
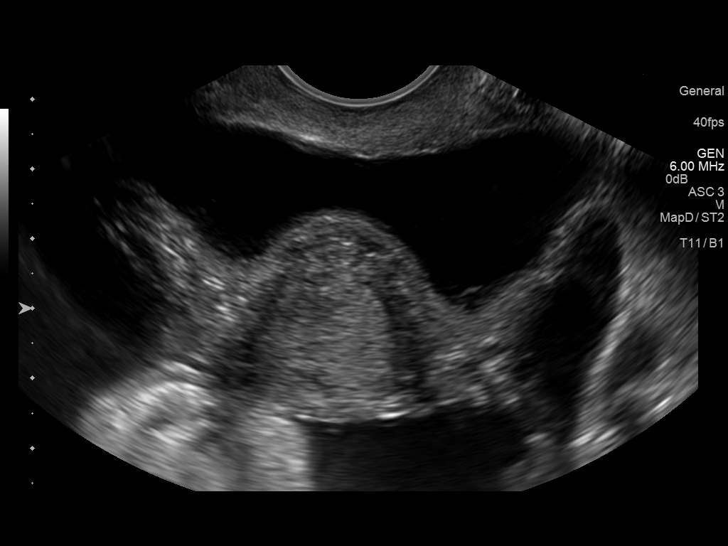
[im 42/63]
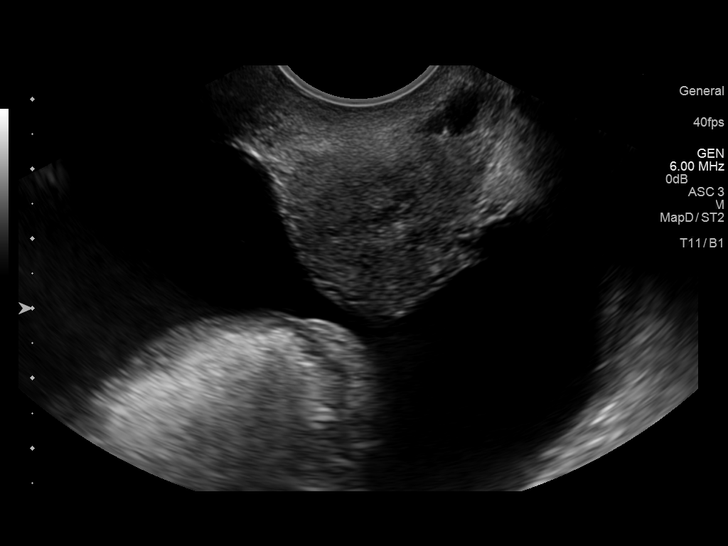
[im 47/63]
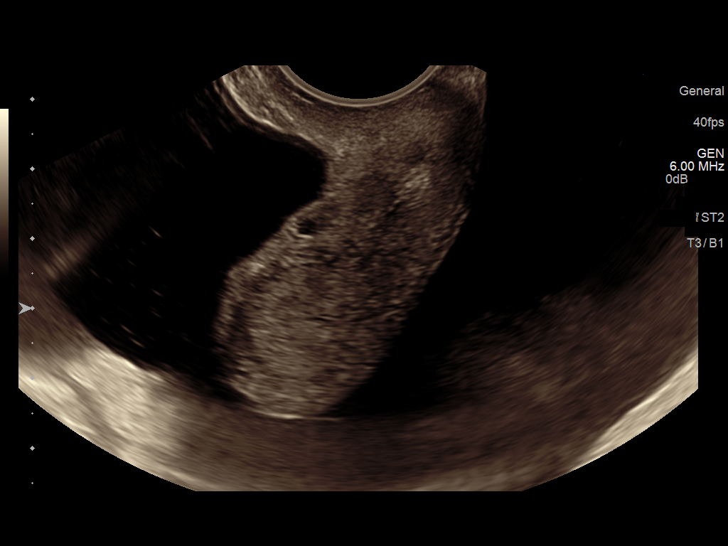
[im 52/63]
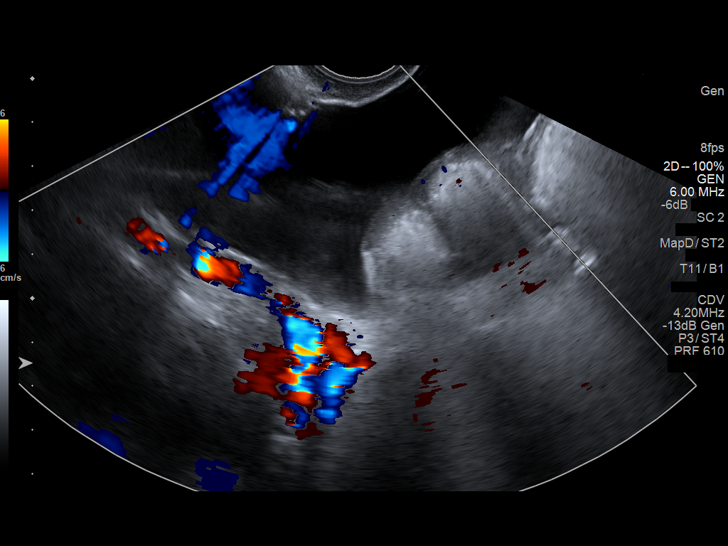
[im 57/63]
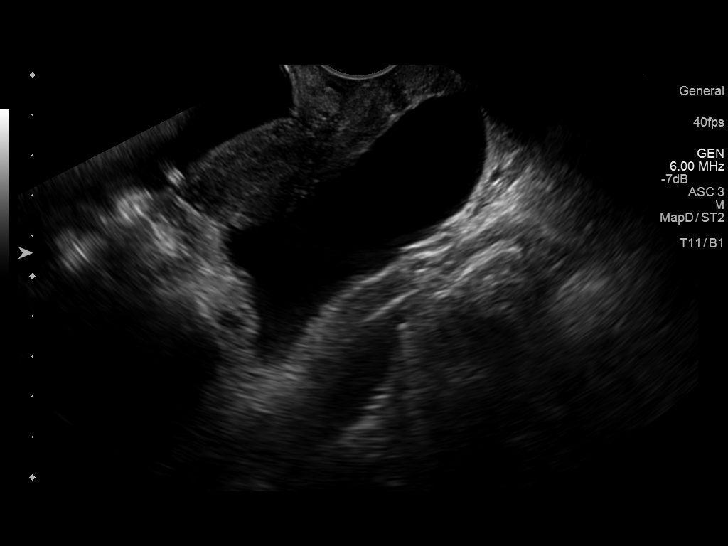
[im 63/63]
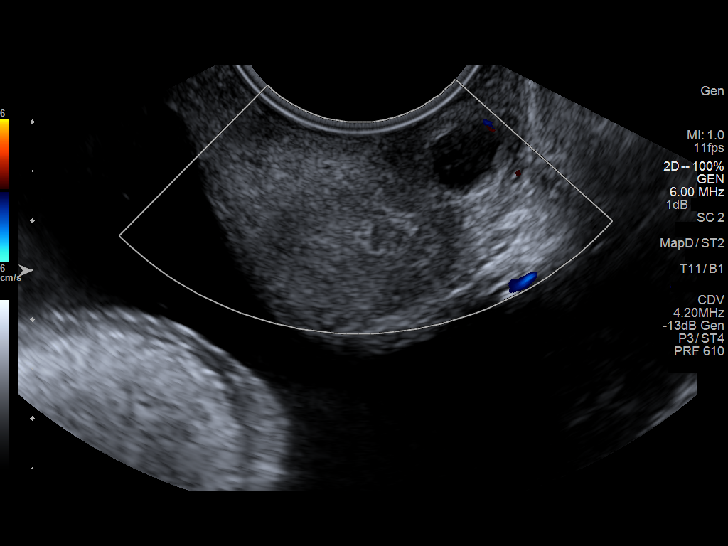

[14 of 25 positions shown; findings below may reference images not displayed]

FINDINGS: Uterus

Measurements: 6.1 x 2.3 x 3.4 cm.. No fibroids or other mass
visualized.

Endometrium

Thickness: 16 mm.  No focal abnormality visualized.

Right ovary

Not well seen

Left ovary

Not well seen

Other findings

Significant ascites is noted within the pelvis. This is similar to
that noted on 11/30/14
IMPRESSION: Nonvisualization of the ovaries.

Large amount of ascites.

Prominent endometrium likely related to the current clinical
history.
